# Patient Record
Sex: Female | Born: 1980 | Race: White | Hispanic: No | Marital: Married | Smoking: Never smoker
Health system: Southern US, Community
[De-identification: ages and names within clinical notes are randomized; demographics above are authoritative.]

## PROBLEM LIST (undated history)

## (undated) DIAGNOSIS — J45909 Unspecified asthma, uncomplicated: Secondary | ICD-10-CM

## (undated) DIAGNOSIS — G43909 Migraine, unspecified, not intractable, without status migrainosus: Secondary | ICD-10-CM

## (undated) DIAGNOSIS — E7800 Pure hypercholesterolemia, unspecified: Secondary | ICD-10-CM

## (undated) DIAGNOSIS — K219 Gastro-esophageal reflux disease without esophagitis: Secondary | ICD-10-CM

## (undated) DIAGNOSIS — D649 Anemia, unspecified: Secondary | ICD-10-CM

## (undated) HISTORY — DX: Morbid (severe) obesity due to excess calories: E66.01

## (undated) HISTORY — PX: WRIST FRACTURE SURGERY: SHX121

## (undated) HISTORY — PX: TONSILLECTOMY: SUR1361

---

## 2021-04-11 ENCOUNTER — Other Ambulatory Visit: Payer: Self-pay

## 2021-04-11 ENCOUNTER — Encounter (HOSPITAL_BASED_OUTPATIENT_CLINIC_OR_DEPARTMENT_OTHER): Payer: Self-pay | Admitting: Emergency Medicine

## 2021-04-11 ENCOUNTER — Emergency Department (HOSPITAL_BASED_OUTPATIENT_CLINIC_OR_DEPARTMENT_OTHER)
Admission: EM | Admit: 2021-04-11 | Discharge: 2021-04-11 | Disposition: A | Discharge status: Home / Self Care | Payer: Self-pay | Attending: Emergency Medicine | Admitting: Emergency Medicine

## 2021-04-11 DIAGNOSIS — J45909 Unspecified asthma, uncomplicated: Secondary | ICD-10-CM | POA: Diagnosis not present

## 2021-04-11 DIAGNOSIS — H9203 Otalgia, bilateral: Secondary | ICD-10-CM | POA: Insufficient documentation

## 2021-04-11 DIAGNOSIS — J029 Acute pharyngitis, unspecified: Secondary | ICD-10-CM | POA: Diagnosis not present

## 2021-04-11 HISTORY — DX: Gastro-esophageal reflux disease without esophagitis: K21.9

## 2021-04-11 HISTORY — DX: Migraine, unspecified, not intractable, without status migrainosus: G43.909

## 2021-04-11 HISTORY — DX: Unspecified asthma, uncomplicated: J45.909

## 2021-04-11 LAB — GROUP A STREP BY PCR: Group A Strep by PCR: NOT DETECTED

## 2021-04-11 MED ORDER — PREDNISONE 10 MG PO TABS: ORAL_TABLET | ORAL | 0 refills | Status: AC

## 2021-04-11 MED ORDER — LIDOCAINE VISCOUS HCL 2 % MT SOLN: 15.0000 mL | OROMUCOSAL | 2 refills | Status: DC | PRN

## 2021-04-11 NOTE — Discharge Instructions (Addendum)
General Viral Syndrome Care Instructions:  Your symptoms are likely consistent with a viral illness. Viruses do not require or respond to antibiotics. Treatment is symptomatic care and it is important to note that these symptoms may last for 7-14 days.   Hand washing: Wash your hands throughout the day, but especially before and after touching the face, using the restroom, sneezing, coughing, or touching surfaces that have been coughed or sneezed upon. Hydration: Symptoms of most illnesses will be intensified and complicated by dehydration. Dehydration can also extend the duration of symptoms. Drink plenty of fluids and get plenty of rest. You should be drinking at least half a liter of water an hour to stay hydrated. Electrolyte drinks (ex. Gatorade, Powerade, Pedialyte) are also encouraged. You should be drinking enough fluids to make your urine light yellow, almost clear. If this is not the case, you are not drinking enough water. Please note that some of the treatments indicated below will not be effective if you are not adequately hydrated. Diet: Please concentrate on hydration, however, you may introduce food slowly.  Start with a clear liquid diet, progressed to a full liquid diet, and then bland solids as you are able. Pain or fever: Ibuprofen, Naproxen, or acetaminophen (generic for Tylenol) for pain or fever.  Antiinflammatory medications: Take 600 mg of ibuprofen every 6 hours or 440 mg (over the counter dose) to 500 mg (prescription dose) of naproxen every 12 hours for the next 3 days. After this time, these medications may be used as needed for pain. Take these medications with food to avoid upset stomach. Choose only one of these medications, do not take them together. Acetaminophen (generic for Tylenol): Should you continue to have additional pain while taking the ibuprofen or naproxen, you may add in acetaminophen as needed. Your daily total maximum amount of acetaminophen from all sources  should be limited to 4000mg /day for persons without liver problems, or 2000mg /day for those with liver problems. Cough: Teas, warm liquids, broths, and honey can help with cough. Prednisone: Take the prednisone, as directed, in its entirety. Viscous lidocaine: Use the viscous lidocaine by gargling a small amount as needed to help with throat pain. Zyrtec or Claritin: May add these medication daily to control underlying symptoms of congestion, sneezing, and other signs of allergies.  These medications are available over-the-counter. Generics: Cetirizine (generic for Zyrtec) and loratadine (generic for Claritin). Fluticasone: Use fluticasone (generic for Flonase), as directed, for nasal and sinus congestion.  This medication is available over-the-counter. Congestion: Plain guaifenesin (generic for plain Mucinex) may help relieve congestion. Saline sinus rinses and saline nasal sprays may also help relieve congestion. If you do not have high blood pressure, heart problems, or an allergy to such medications, you may also try phenylephrine or Sudafed. Sore throat: Warm liquids or Chloraseptic spray may help soothe a sore throat. Gargle twice a day with a salt water solution made from a half teaspoon of salt in a cup of warm water.  Follow up: Follow up with a primary care provider within the next two weeks should symptoms fail to resolve. Return: Return to the ED for significantly worsening symptoms, shortness of breath, persistent vomiting, large amounts of blood in stool, or any other major concerns.  For prescription assistance, may try using prescription discount sites or apps, such as goodrx.com

## 2021-04-11 NOTE — ED Triage Notes (Signed)
Pt reports sore throat that started yesterday. Denies fevers.

## 2021-04-11 NOTE — ED Provider Notes (Signed)
[facility] EMERGENCY DEPARTMENT Provider Note   CSN: [id number] Arrival date & time: 04/11/21  0856     History Chief Complaint  Patient presents with   Sore Throat    Madison Waters is a 40 y.o. female.  HPI     Madison Waters is a 40 y.o. female, with a history of asthma, GERD, migraines, presenting to the ED with sore throat beginning yesterday. Bilateral throat pain, described as soreness, moderate, nonradiating. Also endorses bilateral ear pain and pressure, nasal congestion, cough.  Denies fever/chills, chest pain, shortness of breath, N/V/D, neck swelling, drooling, or any other complaints.   Past Medical History:  Diagnosis Date   Asthma    GERD (gastroesophageal reflux disease)    Migraines     There are no problems to display for this patient.   History reviewed. No pertinent surgical history.   OB History   No obstetric history on file.     History reviewed. No pertinent family history.     Home Medications Prior to Admission medications   Medication Sig Start Date End Date Taking? Authorizing Provider  lidocaine (XYLOCAINE) 2 % solution Use as directed 15 mLs in the mouth or throat as needed for mouth pain. 04/11/21  Yes Isaah Furry C, PA-C  predniSONE (DELTASONE) 10 MG tablet Take 4 tablets (40 mg total) by mouth daily for 3 days, THEN 3 tablets (30 mg total) daily for 1 day, THEN 2 tablets (20 mg total) daily for 1 day, THEN 1 tablet (10 mg total) daily for 1 day. 04/11/21 04/17/21 Yes Holliday Sheaffer C, PA-C    Allergies    Sulfa antibiotics  Review of Systems   Review of Systems  Constitutional:  Negative for chills, diaphoresis and fever.  HENT:  Positive for congestion, ear pain, sinus pressure and sore throat. Negative for ear discharge, facial swelling, rhinorrhea, sinus pain and trouble swallowing.   Respiratory:  Positive for cough. Negative for shortness of breath.   Cardiovascular:  Negative for chest pain.  Gastrointestinal:   Negative for abdominal pain, diarrhea, nausea and vomiting.  Musculoskeletal:  Negative for neck pain and neck stiffness.  Neurological:  Negative for weakness.  All other systems reviewed and are negative.  Physical Exam Updated Vital Signs BP 137/89 (BP Location: Left Arm)   Pulse (!) 109   Temp 99.2 F (37.3 C) (Oral)   Resp 17   Ht 5\' 4"  (1.626 m)   Wt 122.5 kg   LMP 03/13/2021 (Approximate)   SpO2 100%   BMI 46.35 kg/m   Physical Exam Vitals and nursing note reviewed.  Constitutional:      General: She is not in acute distress.    Appearance: She is well-developed. She is not diaphoretic.  HENT:     Head: Normocephalic and atraumatic.     Right Ear: Tympanic membrane, ear canal and external ear normal.     Left Ear: Tympanic membrane, ear canal and external ear normal.     Nose: Congestion present.     Right Sinus: No maxillary sinus tenderness or frontal sinus tenderness.     Left Sinus: No maxillary sinus tenderness or frontal sinus tenderness.     Mouth/Throat:     Mouth: Mucous membranes are moist.     Pharynx: Uvula midline. Posterior oropharyngeal erythema present. No pharyngeal swelling, oropharyngeal exudate or uvula swelling.     Comments: No noted area of intraoral swelling or fluctuance.  No trismus or noted abnormal phonation.  Mouth opening to at least 3 finger widths.  Handles oral secretions without difficulty.  No noted facial swelling.  No sublingual swelling or tongue elevation.  No swelling or tenderness to the submental or submandibular regions.  No swelling or tenderness into the soft tissues of the neck.  Eyes:     Conjunctiva/sclera: Conjunctivae normal.  Cardiovascular:     Rate and Rhythm: Normal rate and regular rhythm.     Pulses: Normal pulses.     Heart sounds: Normal heart sounds.  Pulmonary:     Effort: Pulmonary effort is normal. No respiratory distress.     Breath sounds: Normal breath sounds.     Comments: No increased work of  breathing.  Speaks in full sentences without difficulty. Abdominal:     Tenderness: There is no guarding.  Musculoskeletal:     Cervical back: Normal range of motion and neck supple.  Lymphadenopathy:     Cervical: Cervical adenopathy present.  Skin:    General: Skin is warm and dry.  Neurological:     Mental Status: She is alert.  Psychiatric:        Mood and Affect: Mood and affect normal.        Speech: Speech normal.        Behavior: Behavior normal.    ED Results / Procedures / Treatments   Labs (all labs ordered are listed, but only abnormal results are displayed) Labs Reviewed  GROUP A STREP BY PCR    EKG None  Radiology No results found.  Procedures Procedures   Medications Ordered in ED Medications - No data to display  ED Course  I have reviewed the triage vital signs and the nursing notes.  Pertinent labs & imaging results that were available during my care of the patient were reviewed by me and considered in my medical decision making (see chart for details).    MDM Rules/Calculators/A&P                          Patient presents with sore throat, cough, congestion. Patient is nontoxic appearing, afebrile, not tachycardic on my exam, not tachypneic, not hypotensive, maintains excellent SPO2 on room air, and is in no apparent distress.   Strep test negative. Discussed COVID/influenza testing, chest x-ray, imaging of the neck.  Shared decision making.  Patient declined. The patient was given instructions for home care as well as return precautions. Patient voices understanding of these instructions, accepts the plan, and is comfortable with discharge.    Final Clinical Impression(s) / ED Diagnoses Final diagnoses:  Sore throat    Rx / DC Orders ED Discharge Orders          Ordered    predniSONE (DELTASONE) 10 MG tablet        04/11/21 1105    lidocaine (XYLOCAINE) 2 % solution  As needed        04/11/21 1105             Anselm Pancoast,  PA-C 04/11/21 1151    Gwyneth Sprout, MD 04/13/21 2212

## 2021-05-13 ENCOUNTER — Ambulatory Visit
Admission: RE | Admit: 2021-05-13 | Discharge: 2021-05-13 | Disposition: A | Discharge status: Home / Self Care | Payer: PRIVATE HEALTH INSURANCE | Source: Ambulatory Visit | Attending: Sports Medicine | Admitting: Sports Medicine

## 2021-05-13 ENCOUNTER — Other Ambulatory Visit: Payer: Self-pay

## 2021-05-13 ENCOUNTER — Other Ambulatory Visit: Payer: Self-pay | Admitting: Sports Medicine

## 2021-05-13 DIAGNOSIS — M25562 Pain in left knee: Secondary | ICD-10-CM

## 2021-05-13 DIAGNOSIS — M25561 Pain in right knee: Secondary | ICD-10-CM

## 2021-05-26 ENCOUNTER — Other Ambulatory Visit: Payer: Self-pay | Admitting: Physician Assistant

## 2021-05-26 ENCOUNTER — Ambulatory Visit
Admission: RE | Admit: 2021-05-26 | Discharge: 2021-05-26 | Disposition: A | Discharge status: Home / Self Care | Payer: PRIVATE HEALTH INSURANCE | Source: Ambulatory Visit | Attending: Physician Assistant | Admitting: Physician Assistant

## 2021-05-26 DIAGNOSIS — R509 Fever, unspecified: Secondary | ICD-10-CM

## 2021-05-26 DIAGNOSIS — R051 Acute cough: Secondary | ICD-10-CM

## 2021-05-26 DIAGNOSIS — R490 Dysphonia: Secondary | ICD-10-CM

## 2021-12-17 ENCOUNTER — Ambulatory Visit: Payer: PRIVATE HEALTH INSURANCE | Attending: Physical Therapy | Admitting: Physical Therapy

## 2021-12-17 NOTE — Therapy (Deleted)
?OUTPATIENT PHYSICAL THERAPY LOWER EXTREMITY EVALUATION ? ? ?Patient Name: Madison Waters ?MRN: [id number] ?DOB:09-10-1981, 41 y.o., female ?Today's Date: 12/17/2021 ? ? ? ?Past Medical History:  ?Diagnosis Date  ? Asthma   ? GERD (gastroesophageal reflux disease)   ? Migraines   ? ?No past surgical history on file. ?There are no problems to display for this patient. ? ? ?PCP: Pcp, No ? ?REFERRING PROVIDER: Christena Deem, MD ? ?THERAPY DIAG:  ?No diagnosis found. ? ?REFERRING DIAG: Referral diagnosis: Right knee pain [M25.561] ? ?SUBJECTIVE: ? ?PERTINENT PAST HISTORY:  ?***   ?     ?PRECAUTIONS: {Therapy precautions:24002} ? ?WEIGHT BEARING RESTRICTIONS No ? ?FALLS:  ?Has patient fallen in last 6 months? {yes/no:20286}, Number of falls: *** ? ?LIVING ENVIRONMENT: ?Lives with: {[facility] lives with:25569::"lives with their family"} ?Stairs: {yes/no:20286}; {Stairs:24000} ? ?MOI/History of condition: ? ?Onset date: *** ? ?Madison Waters is a 41 y.o. female who presents to clinic with chief complaint of ***.  *** ? ?From referring provider:  ? ? ? ? Red flags:  ?{has/denies:26543} {kerredflag:26542} ? ?Pain:  ?Are you having pain? {yes/no:20286} ?Pain location: *** ?NPRS scale:  ?current {NUMBERS; 0-10:5044}/10  ?average {NUMBERS; 0-10:5044}/10  ?Aggravating factors: *** ? NPRS, highest: {NUMBERS; 0-10:5044}/10 ?Relieving factors: *** ? NPRS: best: {NUMBERS; 0-10:5044}/10 ?Pain description: {PAIN DESCRIPTION:21022940} ?Stage: {Desc; acute/subacute/chronic:13799} ?Stability: {kerbetterworse:26715} ?24 hour pattern: ***  ? ?Occupation: *** ? ?Assistive Device: *** ? ?Hand Dominance: *** ? ?Patient Goals/Specific Activities: *** ? ? ?PLOF: {PLOF:24004} ? ?DIAGNOSTIC FINDINGS: *** ? ? ?OBJECTIVE:  ? ? GENERAL OBSERVATION/GAIT: ?  *** ? ?SENSATION: ? Light touch: {intact/deficits:24005} ? ?PALPATION: ?*** ? ?MUSCLE LENGTH: ?Hamstrings: Right {kerminsig:27227} restriction; Left {kerminsig:27227} restriction ?ASLR: Right  {keraslr:27228}; Left {keraslr:27228} ?Thomas test: Right {kerminsig:27227} restriction; Left {kerminsig:27227} restriction ?Ely's test: Right {kerminsig:27227} restriction; Left {kerminsig:27227} restriction ? ?LE MMT: ? ?MMT Right ?12/17/2021 Left ?12/17/2021  ?Hip flexion (L2, L3)    ?Knee extension (L3)    ?Knee flexion    ?Hip abduction    ?Hip extension    ?Hip external rotation    ?Hip internal rotation    ?Hip adduction    ?Ankle dorsiflexion (L4)    ?Ankle plantarflexion (S1)    ?Ankle inversion    ?Ankle eversion    ?Great Toe ext (L5)    ?Grossly    ? ?(Blank rows = not tested, score listed is out of 5 possible points.  N = WNL, D = diminished, C = clear for gross weakness with myotome testing, * = concordant pain with testing) ? ?LE ROM: ? ?ROM Right ?12/17/2021 Left ?12/17/2021  ?Hip flexion    ?Hip extension    ?Hip abduction    ?Hip adduction    ?Hip internal rotation    ?Hip external rotation    ?Knee flexion    ?Knee extension    ?Ankle dorsiflexion    ?Ankle plantarflexion    ?Ankle inversion    ?Ankle eversion    ? ?(Blank rows = not tested, N = WNL, * = concordant pain with testing) ? ?Functional Tests ? ?Eval (12/17/2021) Eval (12/17/2021)   ?30'' STS    ?Progressive balance screen (highest level completed for >/= 10''): ? ?Feet together: ***'' ?Semi Tandem: R in rear ***'', L in rear ***'' ?Tandem: R in rear ***'', L in rear ***'' ?SLS: R ***'', L ***''     ?    ?    ?    ?    ?    ?    ?    ?    ?    ?    ?    ?    ? ? ?  LOWER EXTREMITY SPECIAL TESTS:  ?Knee special tests: {KNEE SPECIAL TESTS:26240} ? ? ?PATIENT SURVEYS:  ?{rehab surveys:24030} ? ? ?TODAY'S TREATMENT: ?*** ? ? ?PATIENT EDUCATION:  ?POC, diagnosis, prognosis, HEP, and outcome measures.  Pt educated via explanation, demonstration, and handout (HEP).  Pt confirms understanding verbally.  ? ?ASTERISK SIGNS ? ? ?Asterisk Signs Eval (12/17/2021)       ?        ?        ?        ?        ?        ? ? ?HOME EXERCISE  PROGRAM: ?*** ? ?ASSESSMENT: ? ?CLINICAL IMPRESSION: ?Madison Waters is a 41 y.o. female who presents to clinic with signs and sxs consistent with ***.   ? ?OBJECTIVE IMPAIRMENTS: Pain, *** ? ?ACTIVITY LIMITATIONS: *** ? ?PERSONAL FACTORS: See medical history and pertinent history ? ? ?REHAB POTENTIAL: {rehabpotential:25112} ? ?CLINICAL DECISION MAKING: {clinical decision making:25114} ? ?EVALUATION COMPLEXITY: {Evaluation complexity:25115} ? ? ?GOALS: ? ? ?SHORT TERM GOALS: ? ?Madison Waters will be >75% HEP compliant to improve carryover between sessions and facilitate independent management of condition ? ?Evaluation (12/17/2021): ongoing ?Target date: 02/11/2022 ?Goal status: INITIAL ? ? ?LONG TERM GOALS: ? ?Madison Waters will improve FOTO score to *** as a proxy for functional improvement ? ?Evaluation/Baseline (12/17/2021): *** ?Target date: 02/11/2022 ?Goal status: INITIAL ? ? ?2.  Madison Waters will self report >/= 50% decrease in pain from evaluation  ? ?Evaluation/Baseline (12/17/2021): ***/10 max pain ?Target date: 02/11/2022 ?Goal status: INITIAL ? ? ?3.  *** ? ? ?4.  *** ? ? ?5.  *** ? ? ?6.  *** ? ? ?PLAN: ?PT FREQUENCY: 1-2x/week ? ?PT DURATION: 8 weeks (Ending 02/11/2022) ? ?PLANNED INTERVENTIONS: Therapeutic exercises, Aquatic therapy, Therapeutic activity, Neuro Muscular re-education, Gait training, Patient/Family education, Joint mobilization, Dry Needling, Electrical stimulation, Spinal mobilization and/or manipulation, Moist heat, Taping, Vasopneumatic device, Ionotophoresis 4mg /ml Dexamethasone, and Manual therapy ? ?PLAN FOR NEXT SESSION: *** ? ? ? PT, DPT ?12/17/2021, 9:29 AM ?

## 2021-12-31 ENCOUNTER — Ambulatory Visit
Admission: RE | Admit: 2021-12-31 | Discharge: 2021-12-31 | Disposition: A | Discharge status: Home / Self Care | Payer: Self-pay | Source: Ambulatory Visit | Attending: Internal Medicine | Admitting: Internal Medicine

## 2021-12-31 ENCOUNTER — Other Ambulatory Visit: Payer: Self-pay | Admitting: Internal Medicine

## 2021-12-31 DIAGNOSIS — J4531 Mild persistent asthma with (acute) exacerbation: Secondary | ICD-10-CM

## 2022-01-06 ENCOUNTER — Other Ambulatory Visit (HOSPITAL_COMMUNITY)
Admission: RE | Admit: 2022-01-06 | Discharge: 2022-01-06 | Disposition: A | Discharge status: Home / Self Care | Payer: PRIVATE HEALTH INSURANCE | Source: Ambulatory Visit | Attending: Nurse Practitioner | Admitting: Nurse Practitioner

## 2022-01-06 DIAGNOSIS — Z124 Encounter for screening for malignant neoplasm of cervix: Secondary | ICD-10-CM | POA: Insufficient documentation

## 2022-01-08 LAB — CYTOLOGY - PAP
Comment: NEGATIVE
Diagnosis: NEGATIVE
High risk HPV: NEGATIVE

## 2022-02-02 ENCOUNTER — Encounter: Payer: Self-pay | Admitting: Neurology

## 2022-03-12 ENCOUNTER — Emergency Department (HOSPITAL_BASED_OUTPATIENT_CLINIC_OR_DEPARTMENT_OTHER)
Admission: EM | Admit: 2022-03-12 | Discharge: 2022-03-12 | Disposition: A | Discharge status: Home / Self Care | Payer: No Typology Code available for payment source | Attending: Emergency Medicine | Admitting: Emergency Medicine

## 2022-03-12 ENCOUNTER — Encounter (HOSPITAL_BASED_OUTPATIENT_CLINIC_OR_DEPARTMENT_OTHER): Payer: Self-pay | Admitting: Emergency Medicine

## 2022-03-12 DIAGNOSIS — R410 Disorientation, unspecified: Secondary | ICD-10-CM | POA: Insufficient documentation

## 2022-03-12 HISTORY — DX: Anemia, unspecified: D64.9

## 2022-03-12 HISTORY — DX: Pure hypercholesterolemia, unspecified: E78.00

## 2022-03-12 LAB — CBC WITH DIFFERENTIAL/PLATELET
Abs Immature Granulocytes: 0.04 10*3/uL (ref 0.00–0.07)
Basophils Absolute: 0 10*3/uL (ref 0.0–0.1)
Basophils Relative: 1 %
Eosinophils Absolute: 0.1 10*3/uL (ref 0.0–0.5)
Eosinophils Relative: 2 %
HCT: 34.5 % — ABNORMAL LOW (ref 36.0–46.0)
Hemoglobin: 10.9 g/dL — ABNORMAL LOW (ref 12.0–15.0)
Immature Granulocytes: 1 %
Lymphocytes Relative: 17 %
Lymphs Abs: 1.4 10*3/uL (ref 0.7–4.0)
MCH: 25.5 pg — ABNORMAL LOW (ref 26.0–34.0)
MCHC: 31.6 g/dL (ref 30.0–36.0)
MCV: 80.6 fL (ref 80.0–100.0)
Monocytes Absolute: 0.3 10*3/uL (ref 0.1–1.0)
Monocytes Relative: 4 %
Neutro Abs: 6.1 10*3/uL (ref 1.7–7.7)
Neutrophils Relative %: 75 %
Platelets: 398 10*3/uL (ref 150–400)
RBC: 4.28 MIL/uL (ref 3.87–5.11)
RDW: 19 % — ABNORMAL HIGH (ref 11.5–15.5)
WBC: 8 10*3/uL (ref 4.0–10.5)
nRBC: 0 % (ref 0.0–0.2)

## 2022-03-12 LAB — COMPREHENSIVE METABOLIC PANEL
ALT: 18 U/L (ref 0–44)
AST: 20 U/L (ref 15–41)
Albumin: 3.6 g/dL (ref 3.5–5.0)
Alkaline Phosphatase: 55 U/L (ref 38–126)
Anion gap: 6 (ref 5–15)
BUN: 13 mg/dL (ref 6–20)
CO2: 26 mmol/L (ref 22–32)
Calcium: 9.1 mg/dL (ref 8.9–10.3)
Chloride: 109 mmol/L (ref 98–111)
Creatinine, Ser: 0.9 mg/dL (ref 0.44–1.00)
GFR, Estimated: >60 mL/min (ref >60)
Glucose, Bld: 116 mg/dL — ABNORMAL HIGH (ref 70–99)
Potassium: 4.1 mmol/L (ref 3.5–5.1)
Sodium: 141 mmol/L (ref 135–145)
Total Bilirubin: 0.3 mg/dL (ref 0.3–1.2)
Total Protein: 6.6 g/dL (ref 6.5–8.1)

## 2022-03-12 LAB — VITAMIN B12: Vitamin B-12: 239 pg/mL (ref 180–914)

## 2022-03-12 LAB — RETICULOCYTES
Immature Retic Fract: 23.7 % — ABNORMAL HIGH (ref 2.3–15.9)
RBC.: 4.37 MIL/uL (ref 3.87–5.11)
Retic Count, Absolute: 78.7 10*3/uL (ref 19.0–186.0)
Retic Ct Pct: 1.8 % (ref 0.4–3.1)

## 2022-03-12 LAB — IRON AND TIBC
Iron: 18 ug/dL — ABNORMAL LOW (ref 28–170)
Saturation Ratios: 4 % — ABNORMAL LOW (ref 10.4–31.8)
TIBC: 405 ug/dL (ref 250–450)
UIBC: 387 ug/dL

## 2022-03-12 LAB — CBG MONITORING, ED: Glucose-Capillary: 153 mg/dL — ABNORMAL HIGH (ref 70–99)

## 2022-03-12 LAB — FERRITIN: Ferritin: 6 ng/mL — ABNORMAL LOW (ref 11–307)

## 2022-03-12 LAB — TSH: TSH: 1.759 u[IU]/mL (ref 0.350–4.500)

## 2022-03-12 LAB — FOLATE: Folate: 16.6 ng/mL (ref >5.9)

## 2022-03-12 MED ORDER — SODIUM CHLORIDE 0.9 % IV BOLUS
1000.0000 mL | Freq: Once | INTRAVENOUS | Status: AC
  Administered 2022-03-12: 1000 mL via INTRAVENOUS

## 2022-03-12 NOTE — Discharge Instructions (Signed)
We saw you in the ER for transient spatial confusion.  All the results in the ER are normal, labs and imaging. We are not sure what is causing your symptoms.  Clinically, this does not appear to be stroke or global amnesia.  The workup in the ER is not complete, and is limited to screening for life threatening and emergent conditions only, so please see a primary care doctor for further evaluation.  Return to the ER if you start having one-sided numbness, one-sided weakness, slurred speech, dizziness, confusion.

## 2022-03-12 NOTE — ED Triage Notes (Signed)
Pt states she went to [employer] this morning and when she left and was driving she realized she was not in the area that she was supposed to be in and does not know how she got to that area

## 2022-03-12 NOTE — ED Notes (Signed)
ED Provider at bedside. 

## 2022-03-13 NOTE — ED Provider Notes (Signed)
[facility] EMERGENCY DEPARTMENT Provider Note   CSN: [id number] Arrival date & time: 03/12/22  7124     History  Chief Complaint  Patient presents with   disoriented    Madison Waters is a 41 y.o. female.  HPI     41 year old female comes into the ER with chief complaint of disorientation.  She indicates that she woke up this morning, dropped her husband off to work.  She then proceeded to go to somewhere for breakfast, and then was supposed to go to work.  However, she noted that she was actually closer to the home.  She did not recall how she ended up driving to her place.   She denies any associated new spinning sensation, vision change, confusion with simple tasks like operating her machinery/car.  At no point did she have chest pain, palpitations.  She denies any new medications.  She does take trazodone for sleep.  No history of similar events in the past.  No history of seizures, stroke.  Home Medications Prior to Admission medications   Medication Sig Start Date End Date Taking? Authorizing Provider  lidocaine (XYLOCAINE) 2 % solution Use as directed 15 mLs in the mouth or throat as needed for mouth pain. 04/11/21   Joy, Shawn C, PA-C      Allergies    Sulfa antibiotics    Review of Systems   Review of Systems  All other systems reviewed and are negative.   Physical Exam Updated Vital Signs BP 130/69   Pulse 92   Temp 98.8 F (37.1 C) (Oral)   Resp (!) 21   Ht 5\' 4"  (1.626 m)   Wt 129.7 kg   LMP 03/01/2022 (Approximate)   SpO2 99%   BMI 49.09 kg/m  Physical Exam Vitals and nursing note reviewed.  Constitutional:      Appearance: She is well-developed.  HENT:     Head: Atraumatic.  Eyes:     Extraocular Movements: Extraocular movements intact.     Pupils: Pupils are equal, round, and reactive to light.  Cardiovascular:     Rate and Rhythm: Normal rate.  Pulmonary:     Effort: Pulmonary effort is normal.  Musculoskeletal:     Cervical  back: Normal range of motion and neck supple.  Skin:    General: Skin is warm and dry.  Neurological:     Mental Status: She is alert and oriented to person, place, and time.     ED Results / Procedures / Treatments   Labs (all labs ordered are listed, but only abnormal results are displayed) Labs Reviewed  COMPREHENSIVE METABOLIC PANEL - Abnormal; Notable for the following components:      Result Value   Glucose, Bld 116 (*)    All other components within normal limits  CBC WITH DIFFERENTIAL/PLATELET - Abnormal; Notable for the following components:   Hemoglobin 10.9 (*)    HCT 34.5 (*)    MCH 25.5 (*)    RDW 19.0 (*)    All other components within normal limits  IRON AND TIBC - Abnormal; Notable for the following components:   Iron 18 (*)    Saturation Ratios 4 (*)    All other components within normal limits  FERRITIN - Abnormal; Notable for the following components:   Ferritin 6 (*)    All other components within normal limits  RETICULOCYTES - Abnormal; Notable for the following components:   Immature Retic Fract 23.7 (*)    All other  components within normal limits  CBG MONITORING, ED - Abnormal; Notable for the following components:   Glucose-Capillary 153 (*)    All other components within normal limits  VITAMIN B12  FOLATE  TSH    EKG EKG Interpretation  Date/Time:  Thursday March 12 2022 07:49:20 EDT Ventricular Rate:  104 PR Interval:  136 QRS Duration: 95 QT Interval:  344 QTC Calculation: 453 R Axis:   70 Text Interpretation: Sinus tachycardia No acute changes No significant change since last tracing Confirmed by Derwood Kaplan [id number]) on 03/12/2022 8:30:10 AM  Radiology No results found.  Procedures Procedures    Medications Ordered in ED Medications  sodium chloride 0.9 % bolus 1,000 mL (0 mLs Intravenous Stopped 03/12/22 0940)    ED Course/ Medical Decision Making/ A&P                           Medical Decision Making Amount and/or  Complexity of Data Reviewed Labs: ordered.   41 year old female comes to the ER after finding herself driving towards her home, when she should have actually been driving to work.  She is unsure how she got on the road towards her home.  She did not realize the mistake and was he was almost at her home.  Patient knew at that time that she was lost.  She knew who she was, where she was and what time of the date was.  She also realized she was in the wrong place and just could not remember driving this far out.  Does not appear that she had any transient global amnesia.  No focal neuro complaints or deficits.  Clinically, does not appear that she had a seizure.  Medication side effects reviewed.  She is on trazodone at night, but doubt that it had an impact given that she has been on that medication for several months now.  In the light of normal neurologic exam, I do not think CT scan of the brain is needed.  Patient is tachycardic at arrival.  We have ordered basic blood work-up including TSH.  Iron studies sent as she is noted to have microcytic anemia.  Patient indicates that she has had iron transfusion needed in the past.  While in the ER, patient was monitored.  She had no similar events.  She remained oriented x3.  Stable for discharge  Final Clinical Impression(s) / ED Diagnoses Final diagnoses:  Transient disorientation    Rx / DC Orders ED Discharge Orders     None         Derwood Kaplan, MD 03/13/22 [phone]

## 2022-04-30 DIAGNOSIS — R195 Other fecal abnormalities: Secondary | ICD-10-CM | POA: Diagnosis not present

## 2022-04-30 DIAGNOSIS — F5104 Psychophysiologic insomnia: Secondary | ICD-10-CM | POA: Diagnosis not present

## 2022-04-30 DIAGNOSIS — D509 Iron deficiency anemia, unspecified: Secondary | ICD-10-CM | POA: Diagnosis not present

## 2022-04-30 DIAGNOSIS — D500 Iron deficiency anemia secondary to blood loss (chronic): Secondary | ICD-10-CM | POA: Diagnosis not present

## 2022-05-30 DIAGNOSIS — R7303 Prediabetes: Secondary | ICD-10-CM | POA: Diagnosis not present

## 2022-06-07 DIAGNOSIS — J019 Acute sinusitis, unspecified: Secondary | ICD-10-CM | POA: Diagnosis not present

## 2022-06-10 DIAGNOSIS — R7303 Prediabetes: Secondary | ICD-10-CM | POA: Diagnosis not present

## 2022-06-26 DIAGNOSIS — K219 Gastro-esophageal reflux disease without esophagitis: Secondary | ICD-10-CM | POA: Diagnosis not present

## 2022-06-26 DIAGNOSIS — R1013 Epigastric pain: Secondary | ICD-10-CM | POA: Diagnosis not present

## 2022-06-26 DIAGNOSIS — R58 Hemorrhage, not elsewhere classified: Secondary | ICD-10-CM | POA: Diagnosis not present

## 2022-06-26 DIAGNOSIS — R110 Nausea: Secondary | ICD-10-CM | POA: Diagnosis not present

## 2022-06-26 NOTE — Progress Notes (Unsigned)
NEUROLOGY CONSULTATION NOTE  Madison Waters MRN: [id number] DOB: 01-22-1981  Referring provider: Thayer Ohm, PA Primary care provider: Thayer Ohm, PA  Reason for consult:  headaches and episode of disorientation  Assessment/Plan:   Migraine without aura, without status migrainosus, intractable - aggravated after COVID  Migraine prevention:  Start Aimovig 140mg  every 28 days Migraine rescue:  Try Nurtec.  Has promethazine for nausea if needed. Discontinue OTC NSAIDs and analgesics (Tylenol, Excedrin) Keep headache diary Follow up 4-5 months.    Subjective:  Madison Waters is a 41 year old female with asthma, iron-deficiency anemia, tinnitus and migraines who presents for migraines.  History supplemented by referring provider's note.  Onset:  42 years old.  Became daily since having COVID in end of August 2023. Location:  varies - temples, behind eyes, back of neck Quality:  initially pressure but progresses to throbbing/pounding Intensity:  Severe Aura:  absent Prodrome:  absent Associated symptoms:  Nausea, photophobia, phonophobia, scalp tenderness, joint pain, sometimes blurred vision.  She denies associated unilateral numbness or weakness. Duration:  1 to 2 hours if treated early, otherwise all day Frequency:  Prior to August 2023 - 5-10 times a month; Since August 2023 - daily Frequency of abortive medication: Tylenol and/or Excedrin daily Triggers:  menses, change in weather, sleep deprivation, stress Relieving factors:  ice pack Activity:  affects daily activity.  Pulse elevated.  Has been in some pain due to ovarian cyst rupture.   Past NSAIDS/analgesics:  ibuprofen (GI problems) Past abortive triptans:  sumatriptan (helped but causes sweating and skin sensitivity), rizatriptan Past abortive ergotamine:  none Past muscle relaxants:  none Past anti-emetic:  Zofran Past antihypertensive medications: none Past antidepressant medications:  venlafaxine  (side effects) Past anticonvulsant medications:  topiramate (adverse reaction) Past anti-CGRP:  Ubrelvy 100mg  (not helpful) Other past therapies:  none  Current NSAIDS/analgesics:  Excedrin MIgraine, Tylenol Current triptans:  none Current ergotamine:  none Current anti-emetic:  promethazine 25mg  Current muscle relaxants:  none Current Antihypertensive medications:  none Current Antidepressant medications:  duloxetine 60mg  daily, trazodone 100mg  (insomnia) Current Anticonvulsant medications:  none Current anti-CGRP:  Ubrelvy 100mg  Current Vitamins/Herbal/Supplements:  ferrous sulfate Current Antihistamines/Decongestants:  meclizine, zyrtec, Flonase Other therapy:  ice pack Hormone/birth control:  none    Caffeine:  no Diet:  3 liters of water daily.  Avoids soda, spicy foods, citrus, cut out most sugar, may skip lunch Exercise:  not routine Depression:  stable; Anxiety:  stable Other pain:  joint pain Sleep hygiene:  varies.  May take an hour to fall asleep and may wake up several times during the night.  Wakes up with migraines (3 AM) Family history of headache:  unknown      PAST MEDICAL HISTORY: Past Medical History:  Diagnosis Date   Anemia    Asthma    GERD (gastroesophageal reflux disease)    High cholesterol    Migraines     PAST SURGICAL HISTORY: Past Surgical History:  Procedure Laterality Date   TONSILLECTOMY     WRIST FRACTURE SURGERY Left     MEDICATIONS: Current Outpatient Medications on File Prior to Visit  Medication Sig Dispense Refill   lidocaine (XYLOCAINE) 2 % solution Use as directed 15 mLs in the mouth or throat as needed for mouth pain. 100 mL 2   No current facility-administered medications on file prior to visit.    ALLERGIES: Allergies  Allergen Reactions   Sulfa Antibiotics Hives    FAMILY HISTORY: Family History  Problem  Relation Age of Onset   Stroke Mother     Objective:  Blood pressure 136/84, pulse (!) 123, height 5'  4" (1.626 m), weight 264 lb 6.4 oz (119.9 kg), SpO2 97 %. General: No acute distress.  Patient appears well-groomed.   Head:  Normocephalic/atraumatic Eyes:  fundi examined but not visualized Neck: supple, no paraspinal tenderness, full range of motion Back: No paraspinal tenderness Heart: regular rate and rhythm Lungs: Clear to auscultation bilaterally. Vascular: No carotid bruits. Neurological Exam: Mental status: alert and oriented to person, place, and time, speech fluent and not dysarthric, language intact. Cranial nerves: CN I: not tested CN II: pupils equal, round and reactive to light, visual fields intact CN III, IV, VI:  full range of motion, no nystagmus, no ptosis CN V: facial sensation intact. CN VII: upper and lower face symmetric CN VIII: hearing intact CN IX, X: gag intact, uvula midline CN XI: sternocleidomastoid and trapezius muscles intact CN XII: tongue midline Bulk & Tone: normal, no fasciculations. Motor:  muscle strength 5/5 throughout Sensation:  Pinprick, temperature and vibratory sensation intact. Deep Tendon Reflexes:  2+ throughout,  toes downgoing.   Finger to nose testing:  Without dysmetria.   Heel to shin:  Without dysmetria.   Gait:  Normal station and stride.  Romberg negative.    Thank you for allowing me to take part in the care of this patient.  Metta Clines, DO  CC: Thayer Ohm, PA

## 2022-06-29 ENCOUNTER — Emergency Department (HOSPITAL_COMMUNITY)
Admission: EM | Admit: 2022-06-29 | Discharge: 2022-06-29 | Disposition: A | Discharge status: Home / Self Care | Payer: BC Managed Care – PPO | Attending: Emergency Medicine | Admitting: Emergency Medicine

## 2022-06-29 ENCOUNTER — Emergency Department (HOSPITAL_COMMUNITY): Payer: BC Managed Care – PPO

## 2022-06-29 ENCOUNTER — Encounter: Payer: Self-pay | Admitting: Neurology

## 2022-06-29 ENCOUNTER — Ambulatory Visit: Payer: No Typology Code available for payment source | Admitting: Neurology

## 2022-06-29 ENCOUNTER — Other Ambulatory Visit (HOSPITAL_BASED_OUTPATIENT_CLINIC_OR_DEPARTMENT_OTHER): Payer: Self-pay

## 2022-06-29 ENCOUNTER — Telehealth: Payer: Self-pay

## 2022-06-29 ENCOUNTER — Encounter (HOSPITAL_COMMUNITY): Payer: Self-pay

## 2022-06-29 VITALS — BP 136/84 | HR 123 | Ht 64.0 in | Wt 264.4 lb

## 2022-06-29 DIAGNOSIS — R109 Unspecified abdominal pain: Secondary | ICD-10-CM | POA: Diagnosis not present

## 2022-06-29 DIAGNOSIS — J45909 Unspecified asthma, uncomplicated: Secondary | ICD-10-CM | POA: Diagnosis not present

## 2022-06-29 DIAGNOSIS — G43019 Migraine without aura, intractable, without status migrainosus: Secondary | ICD-10-CM

## 2022-06-29 DIAGNOSIS — R1012 Left upper quadrant pain: Secondary | ICD-10-CM | POA: Insufficient documentation

## 2022-06-29 LAB — COMPREHENSIVE METABOLIC PANEL
ALT: 63 U/L — ABNORMAL HIGH (ref 0–44)
AST: 38 U/L (ref 15–41)
Albumin: 3.4 g/dL — ABNORMAL LOW (ref 3.5–5.0)
Alkaline Phosphatase: 87 U/L (ref 38–126)
Anion gap: 6 (ref 5–15)
BUN: 10 mg/dL (ref 6–20)
CO2: 24 mmol/L (ref 22–32)
Calcium: 8.5 mg/dL — ABNORMAL LOW (ref 8.9–10.3)
Chloride: 106 mmol/L (ref 98–111)
Creatinine, Ser: 0.87 mg/dL (ref 0.44–1.00)
GFR, Estimated: >60 mL/min (ref >60)
Glucose, Bld: 108 mg/dL — ABNORMAL HIGH (ref 70–99)
Potassium: 3.9 mmol/L (ref 3.5–5.1)
Sodium: 136 mmol/L (ref 135–145)
Total Bilirubin: 0.6 mg/dL (ref 0.3–1.2)
Total Protein: 6.5 g/dL (ref 6.5–8.1)

## 2022-06-29 LAB — URINALYSIS, ROUTINE W REFLEX MICROSCOPIC
Bacteria, UA: NONE SEEN
Bilirubin Urine: NEGATIVE
Glucose, UA: NEGATIVE mg/dL
Hgb urine dipstick: NEGATIVE
Ketones, ur: NEGATIVE mg/dL
Leukocytes,Ua: NEGATIVE
Nitrite: NEGATIVE
Protein, ur: NEGATIVE mg/dL
Specific Gravity, Urine: 1.017 (ref 1.005–1.030)
pH: 6 (ref 5.0–8.0)

## 2022-06-29 LAB — CBC WITH DIFFERENTIAL/PLATELET
Abs Immature Granulocytes: 0.03 10*3/uL (ref 0.00–0.07)
Basophils Absolute: 0.1 10*3/uL (ref 0.0–0.1)
Basophils Relative: 1 %
Eosinophils Absolute: 0.1 10*3/uL (ref 0.0–0.5)
Eosinophils Relative: 1 %
HCT: 36.3 % (ref 36.0–46.0)
Hemoglobin: 10.9 g/dL — ABNORMAL LOW (ref 12.0–15.0)
Immature Granulocytes: 0 %
Lymphocytes Relative: 47 %
Lymphs Abs: 3.4 10*3/uL (ref 0.7–4.0)
MCH: 23.7 pg — ABNORMAL LOW (ref 26.0–34.0)
MCHC: 30 g/dL (ref 30.0–36.0)
MCV: 78.9 fL — ABNORMAL LOW (ref 80.0–100.0)
Monocytes Absolute: 0.4 10*3/uL (ref 0.1–1.0)
Monocytes Relative: 6 %
Neutro Abs: 3.2 10*3/uL (ref 1.7–7.7)
Neutrophils Relative %: 45 %
Platelets: 279 10*3/uL (ref 150–400)
RBC: 4.6 MIL/uL (ref 3.87–5.11)
RDW: 16.3 % — ABNORMAL HIGH (ref 11.5–15.5)
WBC: 7.1 10*3/uL (ref 4.0–10.5)
nRBC: 0 % (ref 0.0–0.2)

## 2022-06-29 LAB — MAGNESIUM: Magnesium: 2.1 mg/dL (ref 1.7–2.4)

## 2022-06-29 LAB — PREGNANCY, URINE: Preg Test, Ur: NEGATIVE

## 2022-06-29 LAB — LIPASE, BLOOD: Lipase: 25 U/L (ref 11–51)

## 2022-06-29 MED ORDER — IOHEXOL 300 MG/ML  SOLN
100.0000 mL | Freq: Once | INTRAMUSCULAR | Status: AC | PRN
  Administered 2022-06-29: 100 mL via INTRAVENOUS

## 2022-06-29 MED ORDER — AIMOVIG 140 MG/ML ~~LOC~~ SOAJ: 140.0000 mg | SUBCUTANEOUS | 11 refills | Status: DC

## 2022-06-29 MED ORDER — KETOROLAC TROMETHAMINE 15 MG/ML IJ SOLN
15.0000 mg | Freq: Once | INTRAMUSCULAR | Status: AC
  Administered 2022-06-29: 15 mg via INTRAVENOUS
  Filled 2022-06-29: qty 1

## 2022-06-29 MED ORDER — NURTEC 75 MG PO TBDP: 75.0000 mg | ORAL_TABLET | Freq: Every day | ORAL | 11 refills | Status: DC | PRN

## 2022-06-29 MED ORDER — HYDROMORPHONE HCL 1 MG/ML IJ SOLN
0.5000 mg | Freq: Once | INTRAMUSCULAR | Status: AC
  Administered 2022-06-29: 0.5 mg via INTRAVENOUS
  Filled 2022-06-29: qty 1

## 2022-06-29 MED ORDER — LACTATED RINGERS IV BOLUS
1000.0000 mL | Freq: Once | INTRAVENOUS | Status: AC
  Administered 2022-06-29: 1000 mL via INTRAVENOUS

## 2022-06-29 MED ORDER — AIMOVIG 140 MG/ML ~~LOC~~ SOAJ
140.0000 mg | SUBCUTANEOUS | 11 refills | Status: DC
  Filled 2022-06-29: qty 1.12, 28d supply, fill #0

## 2022-06-29 MED ORDER — METOCLOPRAMIDE HCL 5 MG/ML IJ SOLN
10.0000 mg | Freq: Once | INTRAMUSCULAR | Status: AC
  Administered 2022-06-29: 10 mg via INTRAVENOUS
  Filled 2022-06-29: qty 2

## 2022-06-29 NOTE — Progress Notes (Signed)
Medication Samples have been provided to the patient.  Drug name: Nurtec       Strength: 75 mg        Qty: 1   LOT: [id number]  Exp.Date: 08/25  Dosing instructions: as needed  The patient has been instructed regarding the correct time, dose, and frequency of taking this medication, including desired effects and most common side effects.   Venetia Night 3:27 PM 06/29/2022

## 2022-06-29 NOTE — ED Triage Notes (Signed)
Pt arrived via POV, c/o abd pain, left flank pain, urinary frequency and low grade fevers at home. Endorses some nausea, denies any vomiting, diarrhea or dysuria.

## 2022-06-29 NOTE — Telephone Encounter (Signed)
Patient seen in office today. Per Dr.Jaffe Patient to start Aimovig 140 mg.  PA Team if you could start a PA for Aimovig 140 mg.

## 2022-06-29 NOTE — Patient Instructions (Addendum)
  Start Aimovig 140mg  every 28 days.   Take Nurtec at earliest onset of headache.  Maximum 1 tablet in 24 hours. Limit use of pain relievers to no more than 2 days out of the week.  These medications include acetaminophen, NSAIDs (ibuprofen/Advil/Motrin, naproxen/Aleve, triptans (Imitrex/sumatriptan), Excedrin, and narcotics.  This will help reduce risk of rebound headaches. Be aware of common food triggers:  - Caffeine:  coffee, black tea, cola, Mt. Dew  - Chocolate  - Dairy:  aged cheeses (brie, blue, cheddar, gouda, Pringle, provolone, Kismet, Swiss, etc), chocolate milk, buttermilk, sour cream, limit eggs and yogurt  - Nuts, peanut butter  - Alcohol  - Cereals/grains:  FRESH breads (fresh bagels, sourdough, doughnuts), yeast productions  - Processed/canned/aged/cured meats (pre-packaged deli meats, hotdogs)  - MSG/glutamate:  soy sauce, flavor enhancer, pickled/preserved/marinated foods  - Sweeteners:  aspartame (Equal, Nutrasweet).  Sugar and Splenda are okay  - Vegetables:  legumes (lima beans, lentils, snow peas, fava beans, pinto peans, peas, garbanzo beans), sauerkraut, onions, olives, pickles  - Fruit:  avocados, bananas, citrus fruit (orange, lemon, grapefruit), mango  - Other:  Frozen meals, macaroni and cheese Routine exercise Stay adequately hydrated (aim for 64 oz water daily) Keep headache diary Maintain proper stress management Maintain proper sleep hygiene Do not skip meals Consider supplements:  magnesium citrate 400mg  daily, riboflavin 400mg  daily, coenzyme Q10 100mg  three times daily. 12.  Follow up 4-5 months.

## 2022-06-29 NOTE — Discharge Instructions (Addendum)
Take Tylenol at home as needed for pain.  Pain should improve with time.  Please return to the emergency department for any new or worsening symptoms of concern.

## 2022-06-29 NOTE — ED Provider Notes (Signed)
[facility] DEPT Provider Note   CSN: [id number] Arrival date & time: 06/29/22  0709     History  Chief Complaint  Patient presents with   Abdominal Pain    Mima MAYTTE JACOT is a 41 y.o. female.   Abdominal Pain Associated symptoms: fever and nausea   Patient presents for left sided abdominal, flank, and back pain.  Medical history includes anemia, asthma, HLD, GERD, IBS, migraine headaches.  She has a remote history of pyelonephritis 20 years ago.  Onset of her pain was 3 days ago.  Initially, it was in her upper abdomen.  That is since migrated to her left flank and back.  She denies any dysuria but has had urinary frequency.  She has had nausea without vomiting.  She has had poor appetite and limited food intake.  She is able to drink fluids.  2 days ago, she began experiencing subjective fevers.  Current pain is 8/10 in severity.  Pain is worsened with movements and deep inspiration.  She denies any history of abdominal surgeries.  She has been having regular bowel movements, the last of which was this morning.  Her LMP was 1 month ago.  For her pain, she has been taking Tylenol only.  She endorses current nausea.     Home Medications Prior to Admission medications   Medication Sig Start Date End Date Taking? Authorizing Provider  lidocaine (XYLOCAINE) 2 % solution Use as directed 15 mLs in the mouth or throat as needed for mouth pain. 04/11/21   Joy, Shawn C, PA-C      Allergies    Sulfa antibiotics    Review of Systems   Review of Systems  Constitutional:  Positive for appetite change and fever.  Gastrointestinal:  Positive for abdominal pain and nausea.  Genitourinary:  Positive for flank pain.  Musculoskeletal:  Positive for back pain.  All other systems reviewed and are negative.   Physical Exam Updated Vital Signs BP 134/79   Pulse 92   Temp 97.9 F (36.6 C) (Oral)   Resp 16 Comment: Simultaneous filing. User may not have seen  previous data.  SpO2 96%  Physical Exam Vitals and nursing note reviewed.  Constitutional:      General: She is not in acute distress.    Appearance: She is well-developed. She is not ill-appearing, toxic-appearing or diaphoretic.  HENT:     Head: Normocephalic and atraumatic.     Mouth/Throat:     Mouth: Mucous membranes are moist.     Pharynx: Oropharynx is clear.  Eyes:     General: No scleral icterus.    Conjunctiva/sclera: Conjunctivae normal.  Cardiovascular:     Rate and Rhythm: Normal rate and regular rhythm.  Pulmonary:     Effort: Pulmonary effort is normal. No respiratory distress.  Abdominal:     Palpations: Abdomen is soft.     Tenderness: There is abdominal tenderness in the left upper quadrant. There is left CVA tenderness. There is no right CVA tenderness, guarding or rebound.  Musculoskeletal:        General: No swelling.     Cervical back: Neck supple.  Skin:    General: Skin is warm and dry.     Capillary Refill: Capillary refill takes less than 2 seconds.     Coloration: Skin is not cyanotic, jaundiced or pale.  Neurological:     General: No focal deficit present.     Mental Status: She is alert and oriented to person,  place, and time.  Psychiatric:        Mood and Affect: Mood normal.        Behavior: Behavior normal.     ED Results / Procedures / Treatments   Labs (all labs ordered are listed, but only abnormal results are displayed) Labs Reviewed  COMPREHENSIVE METABOLIC PANEL - Abnormal; Notable for the following components:      Result Value   Glucose, Bld 108 (*)    Calcium 8.5 (*)    Albumin 3.4 (*)    ALT 63 (*)    All other components within normal limits  CBC WITH DIFFERENTIAL/PLATELET - Abnormal; Notable for the following components:   Hemoglobin 10.9 (*)    MCV 78.9 (*)    MCH 23.7 (*)    RDW 16.3 (*)    All other components within normal limits  URINE CULTURE  LIPASE, BLOOD  URINALYSIS, ROUTINE W REFLEX MICROSCOPIC  MAGNESIUM   PREGNANCY, URINE    EKG None  Radiology CT ABDOMEN PELVIS W CONTRAST  Result Date: 06/29/2022 CLINICAL DATA:  Nausea and vomiting. Abdominal pain. LEFT flank pain. EXAM: CT ABDOMEN AND PELVIS WITH CONTRAST TECHNIQUE: Multidetector CT imaging of the abdomen and pelvis was performed using the standard protocol following bolus administration of intravenous contrast. RADIATION DOSE REDUCTION: This exam was performed according to the departmental dose-optimization program which includes automated exposure control, adjustment of the mA and/or kV according to patient size and/or use of iterative reconstruction technique. CONTRAST:  14mL OMNIPAQUE IOHEXOL 300 MG/ML  SOLN COMPARISON:  None Available. FINDINGS: Lower chest: No acute abnormality. Hepatobiliary: Benign calcification within the RIGHT hepatic lobe. Otherwise, no focal liver abnormality is seen. No radiopaque gallstones, biliary dilatation, or pericholecystic inflammatory changes. Pancreas: Unremarkable. No pancreatic ductal dilatation or surrounding inflammatory changes. Spleen: Spleen is enlarged.  No focal lesion. Adrenals/Urinary Tract: Normal adrenal glands. Kidneys and adrenal glands are unremarkable. The bladder and visualized portion of the urethra are normal. Stomach/Bowel: Stomach and small bowel loops are unremarkable. The appendix is well seen and normal in appearance. Normal appearance of the large bowel. Vascular/Lymphatic: No significant vascular findings are present. No enlarged abdominal or pelvic lymph nodes. Reproductive: Normal appearance of the uterus. The there is a small amount of fluid surrounding the LEFT ovary. Small ovarian cyst on the LEFT ovary is 1.2 centimeters. Other: Small fat containing paraumbilical hernia is present. Musculoskeletal: No acute or significant osseous findings. IMPRESSION: 1. Splenomegaly.  No focal splenic lesion. 2. 1.2 centimeter LEFT ovarian cyst with small amount of fluid surrounding the LEFT  ovary. Ovarian cyst rupture could account for the patient's LEFT flank pain. Recommend follow-up pelvic ultrasound in 6-8 weeks. 3. Small fat containing paraumbilical hernia. Electronically Signed   By: Nolon Nations M.D.   On: 06/29/2022 10:33   DG Chest Portable 1 View  Result Date: 06/29/2022 CLINICAL DATA:  Left upper quadrant pain EXAM: PORTABLE CHEST 1 VIEW COMPARISON:  December 31, 2021 FINDINGS: No pleural effusion. No pneumothorax. No focal airspace opacity. Unchanged cardiac and mediastinal contours. No displaced rib fractures. Visualized upper abdomen is unremarkable. IMPRESSION: No finding to explain left upper quadrant abdominal pain. Electronically Signed   By: Marin Roberts M.D.   On: 06/29/2022 08:52    Procedures Procedures    Medications Ordered in ED Medications  lactated ringers bolus 1,000 mL (0 mLs Intravenous Stopped 06/29/22 1104)  metoCLOPramide (REGLAN) injection 10 mg (10 mg Intravenous Given 06/29/22 0834)  HYDROmorphone (DILAUDID) injection 0.5 mg (0.5 mg Intravenous  Given 06/29/22 0834)  iohexol (OMNIPAQUE) 300 MG/ML solution 100 mL (100 mLs Intravenous Contrast Given 06/29/22 1009)  ketorolac (TORADOL) 15 MG/ML injection 15 mg (15 mg Intravenous Given 06/29/22 1106)    ED Course/ Medical Decision Making/ A&P                           Medical Decision Making Amount and/or Complexity of Data Reviewed Labs: ordered. Radiology: ordered.  Risk Prescription drug management.   This patient presents to the ED for concern of left-sided abdominal pain, this involves an extensive number of treatment options, and is a complaint that carries with it a high risk of complications and morbidity.  The differential diagnosis includes nephrolithiasis, pyelonephritis, diverticulitis, constipation, adnexal etiology   Co morbidities that complicate the patient evaluation  anemia, asthma, HLD, GERD, IBS, migraine headaches   Additional history obtained:  Additional history  obtained from N/A External records from outside source obtained and reviewed including EMR   Lab Tests:  I Ordered, and personally interpreted labs.  The pertinent results include: Baseline anemia, no leukocytosis, normal electrolytes, normal kidney function, no evidence of UTI or hematuria   Imaging Studies ordered:  I ordered imaging studies including chest x-ray, CT of abdomen and pelvis I independently visualized and interpreted imaging which showed 1.2 cm left ovarian cyst with a small amount of surrounding fluid consistent with cyst rupture I agree with the radiologist interpretation   Cardiac Monitoring: / EKG:  The patient was maintained on a cardiac monitor.  I personally viewed and interpreted the cardiac monitored which showed an underlying rhythm of: Sinus rhythm  Problem List / ED Course / Critical interventions / Medication management  Patient is a pleasant 41 year old female presenting for left-sided abdominal, flank, and back pain.  Onset of pain was 3 days ago.  2 days ago, she had onset of subjective fevers and nausea.  She has not had any vomiting.  She denies dysuria but has had urinary frequency.  On arrival in the ED, vital signs are notable for tachycardia.  She is overall well-appearing on exam but does appear uncomfortable.  She does have tenderness present in left flank and left CVA.  Dilaudid and Reglan were given for symptomatic relief.  Given her poor p.o. intake, patient was ordered IV fluids.  Diagnostic work-up was initiated.  Patient's lab work, including urine studies, is reassuring.  There is no evidence of UTI or hematuria.  CT scan showed findings consistent with left-sided ovarian cyst rupture.  This does clinically correlate to her recent symptoms.  Splenomegaly was also noted and this could be contributing to her discomfort.  I do feel it is likely that ovarian cyst rupture is primary cause of her recent discomfort.  She was given Toradol for continued  analgesia.  She had significant relief of symptoms while in the ED.  She was informed of work-up results and does feel comfortable with discharge home.  She was advised to return for any worsening.  She was discharged in good condition. I ordered medication including IV fluid for hydration; Reglan for nausea; Dilaudid and Toradol for analgesia Reevaluation of the patient after these medicines showed that the patient improved I have reviewed the patients home medicines and have made adjustments as needed   Social Determinants of Health:  Has access to outpatient care         Final Clinical Impression(s) / ED Diagnoses Final diagnoses:  Left flank pain  Rx / DC Orders ED Discharge Orders     None         Godfrey Pick, MD 06/29/22 1219

## 2022-06-30 ENCOUNTER — Encounter: Payer: Self-pay | Admitting: Neurology

## 2022-06-30 ENCOUNTER — Telehealth: Payer: No Typology Code available for payment source | Admitting: Nurse Practitioner

## 2022-06-30 DIAGNOSIS — R110 Nausea: Secondary | ICD-10-CM | POA: Diagnosis not present

## 2022-06-30 LAB — URINE CULTURE: Culture: <10000 — AB

## 2022-06-30 MED ORDER — ONDANSETRON HCL 4 MG PO TABS: 4.0000 mg | ORAL_TABLET | Freq: Three times a day (TID) | ORAL | 0 refills | Status: DC | PRN

## 2022-06-30 NOTE — Progress Notes (Signed)
E-Visit for Nausea  We are sorry that you are not feeling well. Here is how we plan to help!  I reviewed your ER notes and we can go ahead and provide the prescription for Zofran, your work note will come through your Mychart as a letter.   I have prescribed a medication that will help alleviate your symptoms and allow you to stay hydrated:  Zofran 4 mg 1 tablet every 8 hours as needed for nausea and vomiting  HOME CARE: Drink clear liquids.  This is very important! Dehydration (the lack of fluid) can lead to a serious complication.  Start off with 1 tablespoon every 5 minutes for 8 hours. You may begin eating bland foods after 8 hours without vomiting.  Start with saltine crackers, white bread, rice, mashed potatoes, applesauce. After 48 hours on a bland diet, you may resume a normal diet. Try to go to sleep.  Sleep often empties the stomach and relieves the need to vomit.  GET HELP RIGHT AWAY IF:  Your symptoms do not improve or worsen within 2 days after treatment. You have a fever for over 3 days. You cannot keep down fluids after trying the medication.  MAKE SURE YOU:  Understand these instructions. Will watch your condition. Will get help right away if you are not doing well or get worse.    Thank you for choosing an e-visit.  Your e-visit answers were reviewed by a board certified advanced clinical practitioner to complete your personal care plan. Depending upon the condition, your plan could have included both over the counter or prescription medications.  Please review your pharmacy choice. Make sure the pharmacy is open so you can pick up prescription now. If there is a problem, you may contact your provider through [employer] and have the prescription routed to another pharmacy.  Your safety is important to [place]. If you have drug allergies check your prescription carefully.   For the next 24 hours you can use MyChart to ask questions about today's visit, request a  non-urgent call back, or ask for a work or school excuse. You will get an email in the next two days asking about your experience. I hope that your e-visit has been valuable and will speed your recovery.  Meds ordered this encounter  Medications   ondansetron (ZOFRAN) 4 MG tablet    Sig: Take 1 tablet (4 mg total) by mouth every 8 (eight) hours as needed for nausea or vomiting.    Dispense:  20 tablet    Refill:  0     I spent approximately 7 minutes reviewing the patient's history, current symptoms and coordinating their plan of care today.

## 2022-07-01 ENCOUNTER — Other Ambulatory Visit (HOSPITAL_COMMUNITY): Payer: Self-pay

## 2022-07-01 ENCOUNTER — Telehealth: Payer: Self-pay | Admitting: Pharmacy Technician

## 2022-07-01 NOTE — Telephone Encounter (Signed)
Patient Advocate Encounter   Received notification that prior authorization for Aimovig 140MG /ML auto-injectors is required.   PA submitted on 07/01/2022 Key [id number] Status is pending       Lyndel Safe, [facility] Patient Advocate Specialist [facility] Patient Advocate Team Direct Number: [phone]  Fax: [phone]

## 2022-07-06 NOTE — Telephone Encounter (Signed)
Patient Advocate Encounter  Prior Authorization for Aimovig 140MG /ML auto-injectors  has been approved.     Effective: 07-01-2022 to 09-22-2022

## 2022-07-06 NOTE — Telephone Encounter (Signed)
LMOVM for patient, Aimovig approved.

## 2022-07-09 DIAGNOSIS — R1012 Left upper quadrant pain: Secondary | ICD-10-CM | POA: Diagnosis not present

## 2022-07-09 DIAGNOSIS — D509 Iron deficiency anemia, unspecified: Secondary | ICD-10-CM | POA: Diagnosis not present

## 2022-07-09 DIAGNOSIS — K2930 Chronic superficial gastritis without bleeding: Secondary | ICD-10-CM | POA: Diagnosis not present

## 2022-07-09 DIAGNOSIS — R1013 Epigastric pain: Secondary | ICD-10-CM | POA: Diagnosis not present

## 2022-07-09 DIAGNOSIS — R140 Abdominal distension (gaseous): Secondary | ICD-10-CM | POA: Diagnosis not present

## 2022-07-09 DIAGNOSIS — K259 Gastric ulcer, unspecified as acute or chronic, without hemorrhage or perforation: Secondary | ICD-10-CM | POA: Diagnosis not present

## 2022-07-18 DIAGNOSIS — R7303 Prediabetes: Secondary | ICD-10-CM | POA: Diagnosis not present

## 2022-08-03 ENCOUNTER — Telehealth: Payer: No Typology Code available for payment source | Admitting: Physician Assistant

## 2022-08-03 DIAGNOSIS — J019 Acute sinusitis, unspecified: Secondary | ICD-10-CM

## 2022-08-03 DIAGNOSIS — B9689 Other specified bacterial agents as the cause of diseases classified elsewhere: Secondary | ICD-10-CM | POA: Diagnosis not present

## 2022-08-03 MED ORDER — AMOXICILLIN-POT CLAVULANATE 875-125 MG PO TABS: 1.0000 | ORAL_TABLET | Freq: Two times a day (BID) | ORAL | 0 refills | Status: DC

## 2022-08-03 NOTE — Progress Notes (Signed)

## 2022-08-11 DIAGNOSIS — R7303 Prediabetes: Secondary | ICD-10-CM | POA: Diagnosis not present

## 2022-09-01 ENCOUNTER — Telehealth: Payer: No Typology Code available for payment source | Admitting: Physician Assistant

## 2022-09-01 DIAGNOSIS — R300 Dysuria: Secondary | ICD-10-CM

## 2022-09-01 DIAGNOSIS — R509 Fever, unspecified: Secondary | ICD-10-CM

## 2022-09-01 DIAGNOSIS — R109 Unspecified abdominal pain: Secondary | ICD-10-CM

## 2022-09-01 DIAGNOSIS — R10A0 Flank pain, unspecified side: Secondary | ICD-10-CM

## 2022-09-01 NOTE — Progress Notes (Signed)
Because of mid and upper back pain/pain with breathing along with UTI symptoms and fever, I feel your condition warrants further evaluation and I recommend that you be seen in a face to face visit. I am concerned about a more complicated urinary tract infection and you need a urine culture and examination of abdomen and kidneys/flank.   NOTE: There will be NO CHARGE for this eVisit   If you are having a true medical emergency please call 911.      For an urgent face to face visit, [facility] has seven urgent care centers for your convenience:     [facility] at [facility] Directions [phone] [address] [place], [place] [address]    [facility]) Get Driving Directions [phone] [address] [place], [place] [address]  [facility] Center [facility]) Get Driving Directions [phone] [address] [place],  [place]  [address]  [facility] Health Urgent Care Center [facility] - at [employer] Directions  [phone] [address] [place],  [place] [address]   [facility] Health Urgent Care at [facility] Get Driving Directions [phone], [address] [place], [place] [address]   [facility] Health Urgent Care at [facility] Get Driving Directions  [phone] [address] [place], [place] [address]   [facility] Urgent Care at [facility] Directions [phone] [address], [address] [place], [place] [address]  Your MyChart E-visit questionnaire answers were reviewed by a board certified advanced clinical practitioner to complete your personal care plan based on your specific symptoms.  Thank you for using e-Visits.

## 2022-09-02 DIAGNOSIS — R300 Dysuria: Secondary | ICD-10-CM | POA: Diagnosis not present

## 2022-09-02 DIAGNOSIS — N342 Other urethritis: Secondary | ICD-10-CM | POA: Diagnosis not present

## 2022-09-02 DIAGNOSIS — S29012A Strain of muscle and tendon of back wall of thorax, initial encounter: Secondary | ICD-10-CM | POA: Diagnosis not present

## 2022-09-11 ENCOUNTER — Telehealth: Payer: Self-pay | Admitting: Pharmacy Technician

## 2022-09-11 NOTE — Telephone Encounter (Signed)
Patient Advocate Encounter   Received notification that prior authorization for Aimovig 140MG /ML auto-injectors is required.   PA submitted on 09/11/2022 Key [id number] Status is pending       09/13/2022, [facility]Ph[facility] Pharm[facility] Patient Advocate Specialist [facility] Patient Advocate Team Direct Number: [phone]  Fax: [phone]

## 2022-09-12 ENCOUNTER — Telehealth: Payer: BC Managed Care – PPO | Admitting: Nurse Practitioner

## 2022-09-12 DIAGNOSIS — J40 Bronchitis, not specified as acute or chronic: Secondary | ICD-10-CM | POA: Diagnosis not present

## 2022-09-12 MED ORDER — AZITHROMYCIN 250 MG PO TABS: ORAL_TABLET | ORAL | 0 refills | Status: DC

## 2022-09-12 NOTE — Progress Notes (Signed)
We are sorry that you are not feeling well.  Here is how we plan to help!  Based on your presentation I believe you most likely have A cough due to bacteria.  When patients have a fever and a productive cough with a change in color or increased sputum production, we are concerned about bacterial bronchitis.  If left untreated it can progress to pneumonia.  If your symptoms do not improve with your treatment plan it is important that you contact your provider.   I have prescribed Azithromyin 250 mg: two tablets now and then one tablet daily for 4 additonal days    In addition you may use A non-prescription cough medication called Mucinex DM: take 2 tablets every 12 hours.   From your responses in the eVisit questionnaire you describe inflammation in the upper respiratory tract which is causing a significant cough.  This is commonly called Bronchitis and has four common causes:   Allergies Viral Infections Acid Reflux Bacterial Infection Allergies, viruses and acid reflux are treated by controlling symptoms or eliminating the cause. An example might be a cough caused by taking certain blood pressure medications. You stop the cough by changing the medication. Another example might be a cough caused by acid reflux. Controlling the reflux helps control the cough.  USE OF BRONCHODILATOR ("RESCUE") INHALERS: There is a risk from using your bronchodilator too frequently.  The risk is that over-reliance on a medication which only relaxes the muscles surrounding the breathing tubes can reduce the effectiveness of medications prescribed to reduce swelling and congestion of the tubes themselves.  Although you feel brief relief from the bronchodilator inhaler, your asthma may actually be worsening with the tubes becoming more swollen and filled with mucus.  This can delay other crucial treatments, such as oral steroid medications. If you need to use a bronchodilator inhaler daily, several times per day, you should  discuss this with your provider.  There are probably better treatments that could be used to keep your asthma under control.     HOME CARE Only take medications as instructed by your medical team. Complete the entire course of an antibiotic. Drink plenty of fluids and get plenty of rest. Avoid close contacts especially the very young and the elderly Cover your mouth if you cough or cough into your sleeve. Always remember to wash your hands A steam or ultrasonic humidifier can help congestion.   GET HELP RIGHT AWAY IF: You develop worsening fever. You become short of breath You cough up blood. Your symptoms persist after you have completed your treatment plan MAKE SURE YOU  Understand these instructions. Will watch your condition. Will get help right away if you are not doing well or get worse.    Thank you for choosing an e-visit.  Your e-visit answers were reviewed by a board certified advanced clinical practitioner to complete your personal care plan. Depending upon the condition, your plan could have included both over the counter or prescription medications.  Please review your pharmacy choice. Make sure the pharmacy is open so you can pick up prescription now. If there is a problem, you may contact your provider through [employer] and have the prescription routed to another pharmacy.  Your safety is important to [place]. If you have drug allergies check your prescription carefully.   For the next 24 hours you can use MyChart to ask questions about today's visit, request a non-urgent call back, or ask for a work or school excuse. You will  get an email in the next two days asking about your experience. I hope that your e-visit has been valuable and will speed your recovery.  Mary-Margaret Hassell Done, FNP   5-10 minutes spent reviewing and documenting in chart.

## 2022-09-13 ENCOUNTER — Other Ambulatory Visit: Payer: Self-pay | Admitting: Family

## 2022-09-13 MED ORDER — PREDNISONE 10 MG (21) PO TBPK: ORAL_TABLET | ORAL | 0 refills | Status: DC

## 2022-09-13 NOTE — Addendum Note (Signed)
Addended by: Jannifer Rodney A on: 09/13/2022 07:58 AM   Modules accepted: Orders

## 2022-09-14 NOTE — Telephone Encounter (Signed)
Patient Advocate Encounter  Prior Authorization for Aimovig 140MG /ML auto-injectors has been approved through .    Effective: 09-11-2022 to 09-10-2023

## 2022-10-26 DIAGNOSIS — M545 Low back pain, unspecified: Secondary | ICD-10-CM | POA: Diagnosis not present

## 2022-11-17 ENCOUNTER — Other Ambulatory Visit: Payer: Self-pay | Admitting: Neurology

## 2022-11-17 ENCOUNTER — Telehealth: Payer: Self-pay

## 2022-11-17 MED ORDER — SUMATRIPTAN SUCCINATE 100 MG PO TABS: 100.0000 mg | ORAL_TABLET | ORAL | 5 refills | Status: DC | PRN

## 2022-11-17 NOTE — Progress Notes (Deleted)
NEUROLOGY FOLLOW UP OFFICE NOTE  Madison Waters [id number]  Assessment/Plan:   Migraine without aura, without status migrainosus, intractable - aggravated after COVID   Migraine prevention:  Start Aimovig 153m every 28 days Migraine rescue:  Try Nurtec.  Has promethazine for nausea if needed. Discontinue OTC NSAIDs and analgesics (Tylenol, Excedrin) Keep headache diary Follow up 4-5 months.       Subjective:  Madison Waters a 42year old female with asthma, iron-deficiency anemia, tinnitus and migraines who follows up for migraines.  UPDATE: Plan was to start [place]  Nurtec ***  Intensity:  *** Duration:  *** Frequency:  *** Frequency of abortive medication: *** Current NSAIDS/analgesics:  Excedrin MIgraine, Tylenol Current triptans:  none Current ergotamine:  none Current anti-emetic:  promethazine 29mCurrent muscle relaxants:  none Current Antihypertensive medications:  none Current Antidepressant medications:  duloxetine 6066maily, trazodone 100m85mnsomnia) Current Anticonvulsant medications:  none Current anti-CGRP:  Ubrelvy 100mg28mrent Vitamins/Herbal/Supplements:  ferrous sulfate Current Antihistamines/Decongestants:  meclizine, zyrtec, Flonase Other therapy:  ice pack Hormone/birth control:  none       Caffeine:  no Diet:  3 liters of water daily.  Avoids soda, spicy foods, citrus, cut out most sugar, may skip lunch Exercise:  not routine Depression:  stable; Anxiety:  stable Other pain:  joint pain Sleep hygiene:  varies.  May take an hour to fall asleep and may wake up several times during the night.  Wakes up with migraines (3 AM)  HISTORY:  Onset:  19 ye32s old.  Became daily since having COVID in end of August 2023. Location:  varies - temples, behind eyes, back of neck Quality:  initially pressure but progresses to throbbing/pounding Intensity:  Severe Aura:  absent Prodrome:  absent Associated symptoms:  Nausea, photophobia,  phonophobia, scalp tenderness, joint pain, sometimes blurred vision.  She denies associated unilateral numbness or weakness. Duration:  1 to 2 hours if treated early, otherwise all day Frequency:  Prior to August 2023 - 5-10 times a month; Since August 2023 - daily Frequency of abortive medication: Tylenol and/or Excedrin daily Triggers:  menses, change in weather, sleep deprivation, stress Relieving factors:  ice pack Activity:  affects daily activity.   Pulse elevated.  Has been in some pain due to ovarian cyst rupture.     Past NSAIDS/analgesics:  ibuprofen (GI problems) Past abortive triptans:  sumatriptan (helped but causes sweating and skin sensitivity), rizatriptan Past abortive ergotamine:  none Past muscle relaxants:  none Past anti-emetic:  Zofran Past antihypertensive medications: none Past antidepressant medications:  venlafaxine (side effects) Past anticonvulsant medications:  topiramate (adverse reaction) Past anti-CGRP:  Ubrelvy 100mg 14m helpful) Other past therapies:  none    Family history of headache:  unknown  PAST MEDICAL HISTORY: Past Medical History:  Diagnosis Date   Anemia    Asthma    GERD (gastroesophageal reflux disease)    High cholesterol    Migraines     MEDICATIONS: Current Outpatient Medications on File Prior to Visit  Medication Sig Dispense Refill   ADVAIR DISKUS 250-50 MCG/ACT AEPB Inhale 1 puff into the lungs 2 (two) times daily.     albuterol (VENTOLIN HFA) 108 (90 Base) MCG/ACT inhaler Inhale 2 puffs into the lungs every 6 (six) hours as needed.     amoxicillin-clavulanate (AUGMENTIN) 875-125 MG tablet Take 1 tablet by mouth 2 (two) times daily. 14 tablet 0   azithromycin (ZITHROMAX Z-PAK) 250 MG tablet As directed 6 tablet 0  DULoxetine (CYMBALTA) 60 MG capsule Take 60 mg by mouth daily.     Erenumab-aooe (AIMOVIG) 140 MG/ML SOAJ Inject 140 mg into the skin every 28 (twenty-eight) days. 1.12 mL 11   ondansetron (ZOFRAN) 4 MG tablet  Take 1 tablet (4 mg total) by mouth every 8 (eight) hours as needed for nausea or vomiting. 20 tablet 0   pantoprazole (PROTONIX) 40 MG tablet Take 40 mg by mouth 2 (two) times daily.     predniSONE (STERAPRED UNI-PAK 21 TAB) 10 MG (21) TBPK tablet Use as directed 21 tablet 0   promethazine (PHENERGAN) 25 MG tablet SMARTSIG:1 Tablet(s) By Mouth Every 12 Hours PRN     Rimegepant Sulfate (NURTEC) 75 MG TBDP Take 75 mg by mouth daily as needed. 8 tablet 11   sucralfate (CARAFATE) 1 g tablet Take 1 g by mouth 2 (two) times daily.     traZODone (DESYREL) 100 MG tablet Take 100 mg by mouth at bedtime.     No current facility-administered medications on file prior to visit.    ALLERGIES: Allergies  Allergen Reactions   Sulfa Antibiotics Hives    FAMILY HISTORY: Family History  Problem Relation Age of Onset   Stroke Mother       Objective:  *** General: No acute distress.  Patient appears ***-groomed.   Head:  Normocephalic/atraumatic Eyes:  Fundi examined but not visualized Neck: supple, no paraspinal tenderness, full range of motion Heart:  Regular rate and rhythm Lungs:  Clear to auscultation bilaterally Back: No paraspinal tenderness Neurological Exam: alert and oriented to person, place, and time.  Speech fluent and not dysarthric, language intact.  CN II-XII intact. Bulk and tone normal, muscle strength 5/5 throughout.  Sensation to light touch intact.  Deep tendon reflexes 2+ throughout, toes downgoing.  Finger to nose testing intact.  Gait normal, Romberg negative.   Madison Clines, DO  CC: ***

## 2022-11-17 NOTE — Telephone Encounter (Signed)
Patient came into the office thinking she had an appointment today, had to reschedule to next available in July. Madison Waters wanted to advise Dr.Jaffe that insurance no longer covers Nurtec as it is costing her about $1200. Wonders if she can go back to [facility], aware that she had bad side effects but it was a cheaper option for her. [facility] is [id number] [place] [address](Ph: [phone] .

## 2022-11-18 ENCOUNTER — Ambulatory Visit: Payer: No Typology Code available for payment source | Admitting: Neurology

## 2022-11-19 NOTE — Telephone Encounter (Signed)
Called patient and left voicemail that her medication has been sent to [facility]

## 2022-11-20 DIAGNOSIS — M25512 Pain in left shoulder: Secondary | ICD-10-CM | POA: Diagnosis not present

## 2022-12-11 DIAGNOSIS — S29012A Strain of muscle and tendon of back wall of thorax, initial encounter: Secondary | ICD-10-CM | POA: Diagnosis not present

## 2022-12-11 DIAGNOSIS — M67814 Other specified disorders of tendon, left shoulder: Secondary | ICD-10-CM | POA: Diagnosis not present

## 2023-01-01 DIAGNOSIS — S29012A Strain of muscle and tendon of back wall of thorax, initial encounter: Secondary | ICD-10-CM | POA: Diagnosis not present

## 2023-01-01 DIAGNOSIS — M67814 Other specified disorders of tendon, left shoulder: Secondary | ICD-10-CM | POA: Diagnosis not present

## 2023-01-20 DIAGNOSIS — E559 Vitamin D deficiency, unspecified: Secondary | ICD-10-CM | POA: Diagnosis not present

## 2023-01-20 DIAGNOSIS — N951 Menopausal and female climacteric states: Secondary | ICD-10-CM | POA: Diagnosis not present

## 2023-01-20 DIAGNOSIS — K581 Irritable bowel syndrome with constipation: Secondary | ICD-10-CM | POA: Diagnosis not present

## 2023-01-20 DIAGNOSIS — D539 Nutritional anemia, unspecified: Secondary | ICD-10-CM | POA: Diagnosis not present

## 2023-01-20 DIAGNOSIS — Z6838 Body mass index (BMI) 38.0-38.9, adult: Secondary | ICD-10-CM | POA: Diagnosis not present

## 2023-01-20 DIAGNOSIS — Z1159 Encounter for screening for other viral diseases: Secondary | ICD-10-CM | POA: Diagnosis not present

## 2023-01-20 DIAGNOSIS — M129 Arthropathy, unspecified: Secondary | ICD-10-CM | POA: Diagnosis not present

## 2023-01-20 DIAGNOSIS — R7303 Prediabetes: Secondary | ICD-10-CM | POA: Diagnosis not present

## 2023-01-20 DIAGNOSIS — Z0000 Encounter for general adult medical examination without abnormal findings: Secondary | ICD-10-CM | POA: Diagnosis not present

## 2023-01-27 DIAGNOSIS — M25512 Pain in left shoulder: Secondary | ICD-10-CM | POA: Diagnosis not present

## 2023-01-27 DIAGNOSIS — M67814 Other specified disorders of tendon, left shoulder: Secondary | ICD-10-CM | POA: Diagnosis not present

## 2023-01-27 DIAGNOSIS — S29012A Strain of muscle and tendon of back wall of thorax, initial encounter: Secondary | ICD-10-CM | POA: Diagnosis not present

## 2023-02-09 DIAGNOSIS — Z6838 Body mass index (BMI) 38.0-38.9, adult: Secondary | ICD-10-CM | POA: Diagnosis not present

## 2023-02-09 DIAGNOSIS — E559 Vitamin D deficiency, unspecified: Secondary | ICD-10-CM | POA: Diagnosis not present

## 2023-02-09 DIAGNOSIS — M25561 Pain in right knee: Secondary | ICD-10-CM | POA: Diagnosis not present

## 2023-02-09 DIAGNOSIS — D649 Anemia, unspecified: Secondary | ICD-10-CM | POA: Diagnosis not present

## 2023-02-09 DIAGNOSIS — M25562 Pain in left knee: Secondary | ICD-10-CM | POA: Diagnosis not present

## 2023-04-20 DIAGNOSIS — R0602 Shortness of breath: Secondary | ICD-10-CM | POA: Diagnosis not present

## 2023-04-20 DIAGNOSIS — Z6839 Body mass index (BMI) 39.0-39.9, adult: Secondary | ICD-10-CM | POA: Diagnosis not present

## 2023-04-20 DIAGNOSIS — R635 Abnormal weight gain: Secondary | ICD-10-CM | POA: Diagnosis not present

## 2023-04-20 DIAGNOSIS — E785 Hyperlipidemia, unspecified: Secondary | ICD-10-CM | POA: Diagnosis not present

## 2023-04-20 DIAGNOSIS — S29011A Strain of muscle and tendon of front wall of thorax, initial encounter: Secondary | ICD-10-CM | POA: Diagnosis not present

## 2023-04-20 DIAGNOSIS — Z3200 Encounter for pregnancy test, result unknown: Secondary | ICD-10-CM | POA: Diagnosis not present

## 2023-04-27 NOTE — Progress Notes (Unsigned)
NEUROLOGY FOLLOW UP OFFICE NOTE  [name] [id number]  Assessment/Plan:   Migraine without aura, without status migrainosus, intractable - aggravated after COVID   Migraine prevention:  Start Aimovig 140mg  every 28 days Migraine rescue:  Try Nurtec.  Has promethazine for nausea if needed. Discontinue OTC NSAIDs and analgesics (Tylenol, Excedrin) Keep headache diary Follow up 4-5 months.       Subjective:  Madison Waters is a 42 year old female with asthma, iron-deficiency anemia, tinnitus and migraines who follows up for migraines.  UPDATE: Started Aimovig.   *** Nurtec *** Intensity:  *** Duration:  *** Frequency:  *** Frequency of abortive medication: *** Current NSAIDS/analgesics:  Excedrin MIgraine, Tylenol Current triptans:  none Current ergotamine:  none Current anti-emetic:  promethazine 25mg  Current muscle relaxants:  none Current Antihypertensive medications:  none Current Antidepressant medications:  duloxetine 60mg  daily, trazodone 100mg  (insomnia) Current Anticonvulsant medications:  none Current anti-CGRP:  Aimovig 140mg , Ubrelvy 100mg  Current Vitamins/Herbal/Supplements:  ferrous sulfate Current Antihistamines/Decongestants:  meclizine, zyrtec, Flonase Other therapy:  ice pack Hormone/birth control:  none       Caffeine:  no Diet:  3 liters of water daily.  Avoids soda, spicy foods, citrus, cut out most sugar, may skip lunch Exercise:  not routine Depression:  stable; Anxiety:  stable Other pain:  joint pain Sleep hygiene:  varies.  May take an hour to fall asleep and may wake up several times during the night.  Wakes up with migraines (3 AM)  HISTORY:  Onset:  42 years old.  Became daily since having COVID in end of August 2023. Location:  varies - temples, behind eyes, back of neck Quality:  initially pressure but progresses to throbbing/pounding Intensity:  Severe Aura:  absent Prodrome:  absent Associated symptoms:  Nausea,  photophobia, phonophobia, scalp tenderness, joint pain, sometimes blurred vision.  She denies associated unilateral numbness or weakness. Duration:  1 to 2 hours if treated early, otherwise all day Frequency:  Prior to August 2023 - 5-10 times a month; Since August 2023 - daily Frequency of abortive medication: Tylenol and/or Excedrin daily Triggers:  menses, change in weather, sleep deprivation, stress Relieving factors:  ice pack Activity:  affects daily activity.   Pulse elevated.  Has been in some pain due to ovarian cyst rupture.     Past NSAIDS/analgesics:  ibuprofen (GI problems) Past abortive triptans:  sumatriptan (helped but causes sweating and skin sensitivity), rizatriptan Past abortive ergotamine:  none Past muscle relaxants:  none Past anti-emetic:  Zofran Past antihypertensive medications: none Past antidepressant medications:  venlafaxine (side effects) Past anticonvulsant medications:  topiramate (adverse reaction) Past anti-CGRP:  Ubrelvy 100mg  (not helpful) Other past therapies:  none    Family history of headache:  unknown    PAST MEDICAL HISTORY: Past Medical History:  Diagnosis Date   Anemia    Asthma    GERD (gastroesophageal reflux disease)    High cholesterol    Migraines     MEDICATIONS: Current Outpatient Medications on File Prior to Visit  Medication Sig Dispense Refill   ADVAIR DISKUS 250-50 MCG/ACT AEPB Inhale 1 puff into the lungs 2 (two) times daily.     albuterol (VENTOLIN HFA) 108 (90 Base) MCG/ACT inhaler Inhale 2 puffs into the lungs every 6 (six) hours as needed.     amoxicillin-clavulanate (AUGMENTIN) 875-125 MG tablet Take 1 tablet by mouth 2 (two) times daily. 14 tablet 0   azithromycin (ZITHROMAX Z-PAK) 250 MG tablet As directed 6 tablet 0  DULoxetine (CYMBALTA) 60 MG capsule Take 60 mg by mouth daily.     Erenumab-aooe (AIMOVIG) 140 MG/ML SOAJ Inject 140 mg into the skin every 28 (twenty-eight) days. 1.12 mL 11   ondansetron  (ZOFRAN) 4 MG tablet Take 1 tablet (4 mg total) by mouth every 8 (eight) hours as needed for nausea or vomiting. 20 tablet 0   pantoprazole (PROTONIX) 40 MG tablet Take 40 mg by mouth 2 (two) times daily.     predniSONE (STERAPRED UNI-PAK 21 TAB) 10 MG (21) TBPK tablet Use as directed 21 tablet 0   promethazine (PHENERGAN) 25 MG tablet SMARTSIG:1 Tablet(s) By Mouth Every 12 Hours PRN     sucralfate (CARAFATE) 1 g tablet Take 1 g by mouth 2 (two) times daily.     SUMAtriptan (IMITREX) 100 MG tablet Take 1 tablet (100 mg total) by mouth as needed for migraine. May repeat in 2 hours if headache persists or recurs.  Maximum 2 tablets in 24 hours. 10 tablet 5   traZODone (DESYREL) 100 MG tablet Take 100 mg by mouth at bedtime.     No current facility-administered medications on file prior to visit.    ALLERGIES: Allergies  Allergen Reactions   Sulfa Antibiotics Hives    FAMILY HISTORY: Family History  Problem Relation Age of Onset   Stroke Mother       Objective:  *** General: No acute distress.  Patient appears ***-groomed.   Head:  Normocephalic/atraumatic Eyes:  Fundi examined but not visualized Neck: supple, no paraspinal tenderness, full range of motion Heart:  Regular rate and rhythm Lungs:  Clear to auscultation bilaterally Back: No paraspinal tenderness Neurological Exam: alert and oriented.  Speech fluent and not dysarthric, language intact.  CN II-XII intact. Bulk and tone normal, muscle strength 5/5 throughout.  Sensation to light touch intact.  Deep tendon reflexes 2+ throughout, toes downgoing.  Finger to nose testing intact.  Gait normal, Romberg negative.   Shon Millet, DO  CC: ***

## 2023-04-28 ENCOUNTER — Ambulatory Visit: Payer: BC Managed Care – PPO | Admitting: Neurology

## 2023-04-28 ENCOUNTER — Encounter: Payer: Self-pay | Admitting: Neurology

## 2023-04-28 ENCOUNTER — Other Ambulatory Visit: Payer: Self-pay | Admitting: Neurology

## 2023-04-28 VITALS — BP 123/84 | HR 96 | Ht 64.0 in | Wt 285.0 lb

## 2023-04-28 DIAGNOSIS — G43009 Migraine without aura, not intractable, without status migrainosus: Secondary | ICD-10-CM | POA: Diagnosis not present

## 2023-04-28 MED ORDER — NURTEC 75 MG PO TBDP: 1.0000 | ORAL_TABLET | Freq: Every day | ORAL | 11 refills | Status: DC | PRN

## 2023-04-28 MED ORDER — AIMOVIG 140 MG/ML ~~LOC~~ SOAJ: 140.0000 mg | SUBCUTANEOUS | 11 refills | Status: DC

## 2023-04-28 MED ORDER — SUMATRIPTAN SUCCINATE 100 MG PO TABS: 100.0000 mg | ORAL_TABLET | ORAL | 5 refills | Status: DC | PRN

## 2023-04-28 NOTE — Patient Instructions (Addendum)
Aimovig every 28 days Will try for Nurtec again.  Otherwise, continue sumatriptan Limit use of pain relievers to no more than 2 days out of week to prevent risk of rebound or medication-overuse headache. Keep headache diary Follow up 9 months.

## 2023-04-29 ENCOUNTER — Telehealth: Payer: Self-pay

## 2023-04-29 NOTE — Telephone Encounter (Signed)
PA needed for Nurtec.   PA team please start PA for Nurtec.

## 2023-05-05 ENCOUNTER — Other Ambulatory Visit (HOSPITAL_COMMUNITY): Payer: Self-pay

## 2023-05-05 ENCOUNTER — Telehealth: Payer: Self-pay

## 2023-05-05 NOTE — Telephone Encounter (Signed)
PA request has been Submitted. New Encounter created for follow up. For additional info see Pharmacy Prior Auth telephone encounter from 08/07.

## 2023-05-05 NOTE — Telephone Encounter (Signed)
*  [facility]  Pharmacy Patient Advocate Encounter   Received notification from Pt Calls Messages that prior authorization for Nurtec 75MG  dispersible tablets  is required/requested.   Insurance verification completed.   The patient is insured through [facility] .   Per test claim: PA required; PA submitted to [employer] via CoverMyMeds Key/confirmation #/EOC [id number] Status is pending

## 2023-05-10 ENCOUNTER — Other Ambulatory Visit (HOSPITAL_COMMUNITY): Payer: Self-pay

## 2023-05-10 NOTE — Telephone Encounter (Signed)
Pharmacy Patient Advocate Encounter  Received notification from [facility] that Prior Authorization for Nurtec 75MG  dispersible tablets has been DENIED. Please advise how you'd like to proceed. Full denial letter will be uploaded to the media tab. See denial reason below.  This medication is not on the formulary. The member must meet the criteria for episodic and acute migraines (as defined in the criteria) to receive more than the plan limit. The allowed amount for acute migraines is 8 pills every 30 days. In this case, the member is using for acute migraine treatment and the request is for 16 tablets per 30 days. Please note: the member meets criteria for this medication within the quantity limit of 8 tablets per 30 days for acute migraine treatment. A separate prior authorization request is required.  PA #/Case ID/Reference #: [id number]

## 2023-05-12 ENCOUNTER — Other Ambulatory Visit: Payer: Self-pay | Admitting: Neurology

## 2023-05-12 MED ORDER — NURTEC 75 MG PO TBDP: 1.0000 | ORAL_TABLET | Freq: Every day | ORAL | 11 refills | Status: DC | PRN

## 2023-05-21 DIAGNOSIS — Z3200 Encounter for pregnancy test, result unknown: Secondary | ICD-10-CM | POA: Diagnosis not present

## 2023-05-21 DIAGNOSIS — Z6838 Body mass index (BMI) 38.0-38.9, adult: Secondary | ICD-10-CM | POA: Diagnosis not present

## 2023-05-21 DIAGNOSIS — E6609 Other obesity due to excess calories: Secondary | ICD-10-CM | POA: Diagnosis not present

## 2023-05-21 DIAGNOSIS — Z833 Family history of diabetes mellitus: Secondary | ICD-10-CM | POA: Diagnosis not present

## 2023-05-28 DIAGNOSIS — Z1231 Encounter for screening mammogram for malignant neoplasm of breast: Secondary | ICD-10-CM | POA: Diagnosis not present

## 2023-05-29 IMAGING — DX DG CHEST 2V
2 series · 2 of 2 positions shown · non-contrast
Comparison: No priors.

CLINICAL DATA: 40-year-old female with history of low-grade fever
and coarseness.

EXAM:
CHEST - 2 VIEW

[dg chest 2 view (1 of 2)]
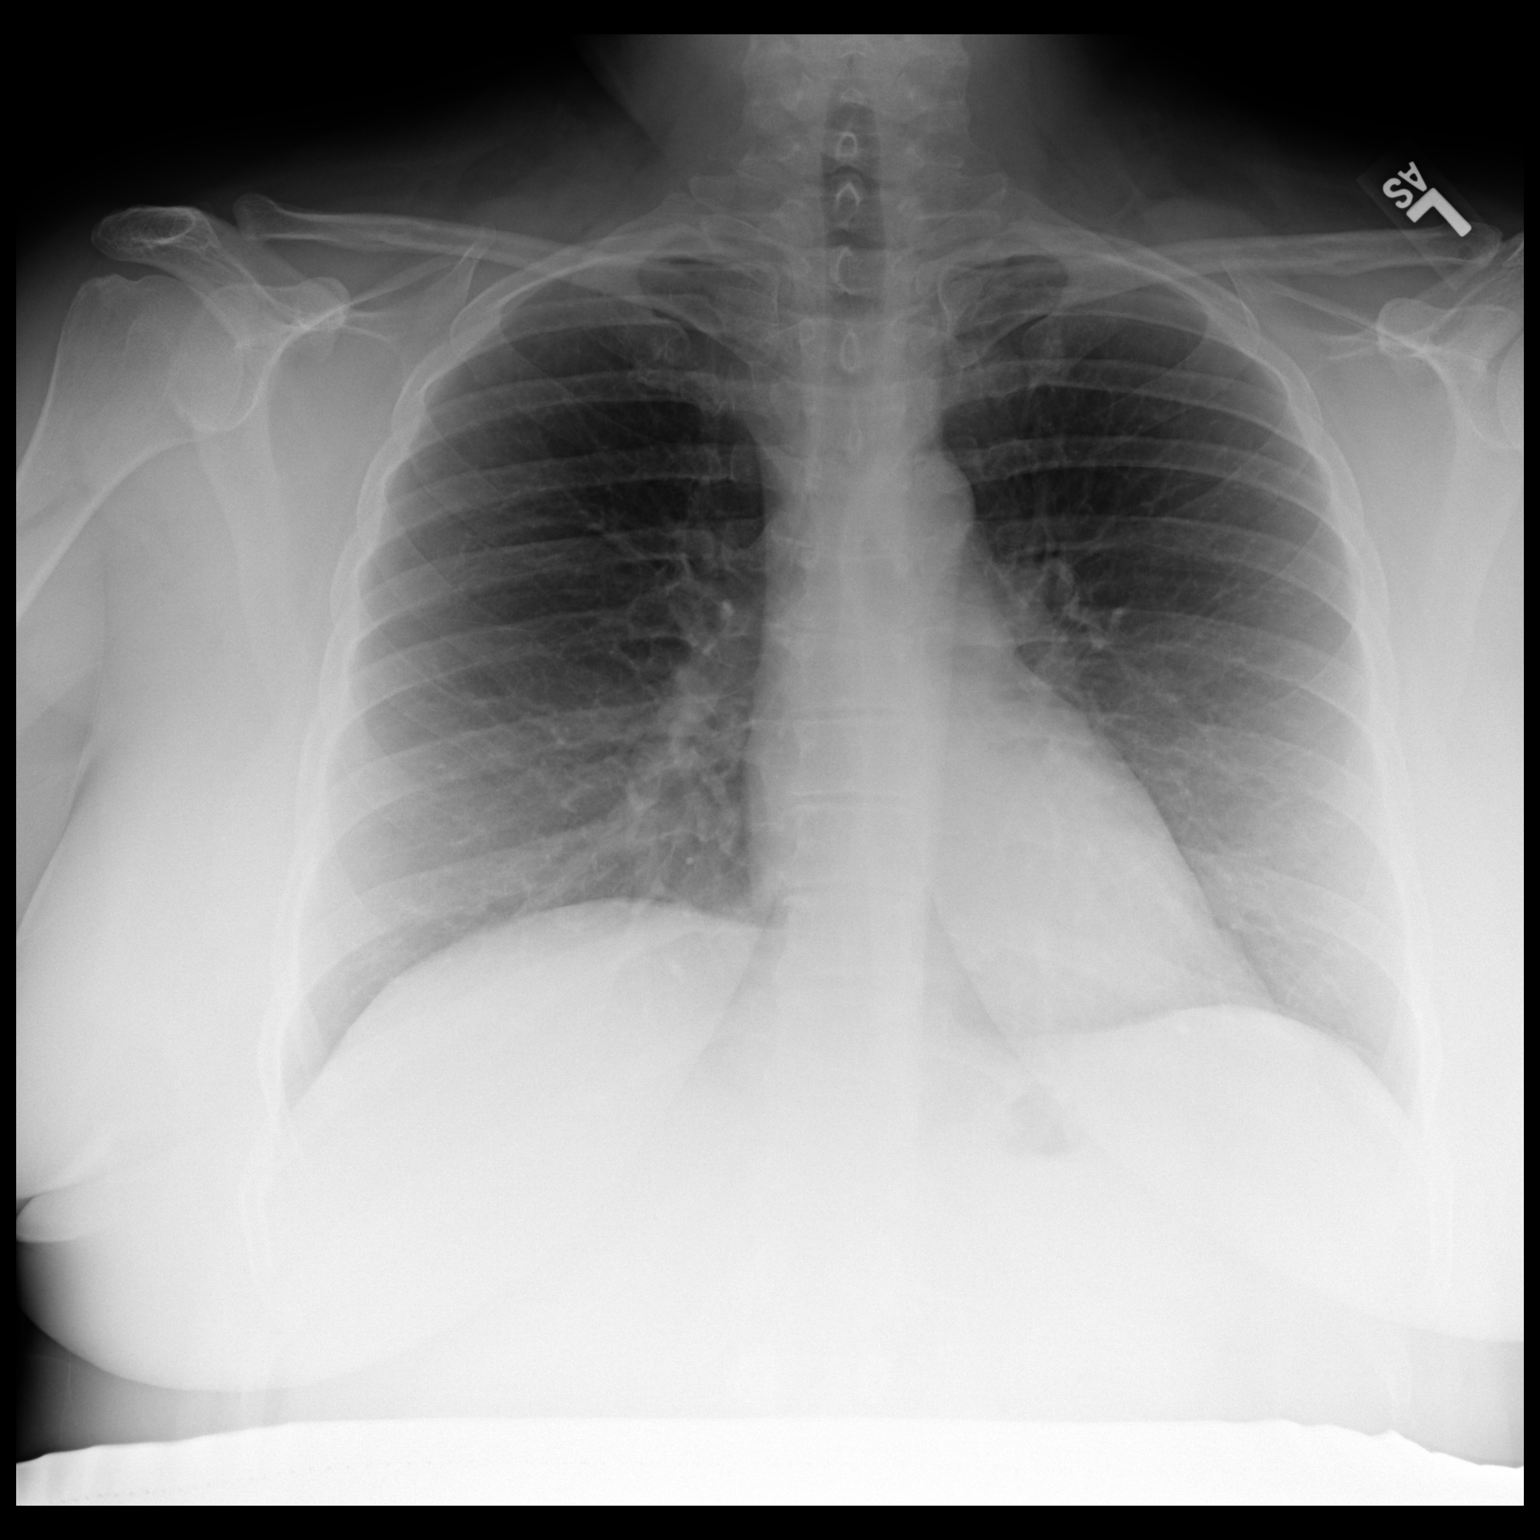

[dg chest 2 view (2 of 2)]
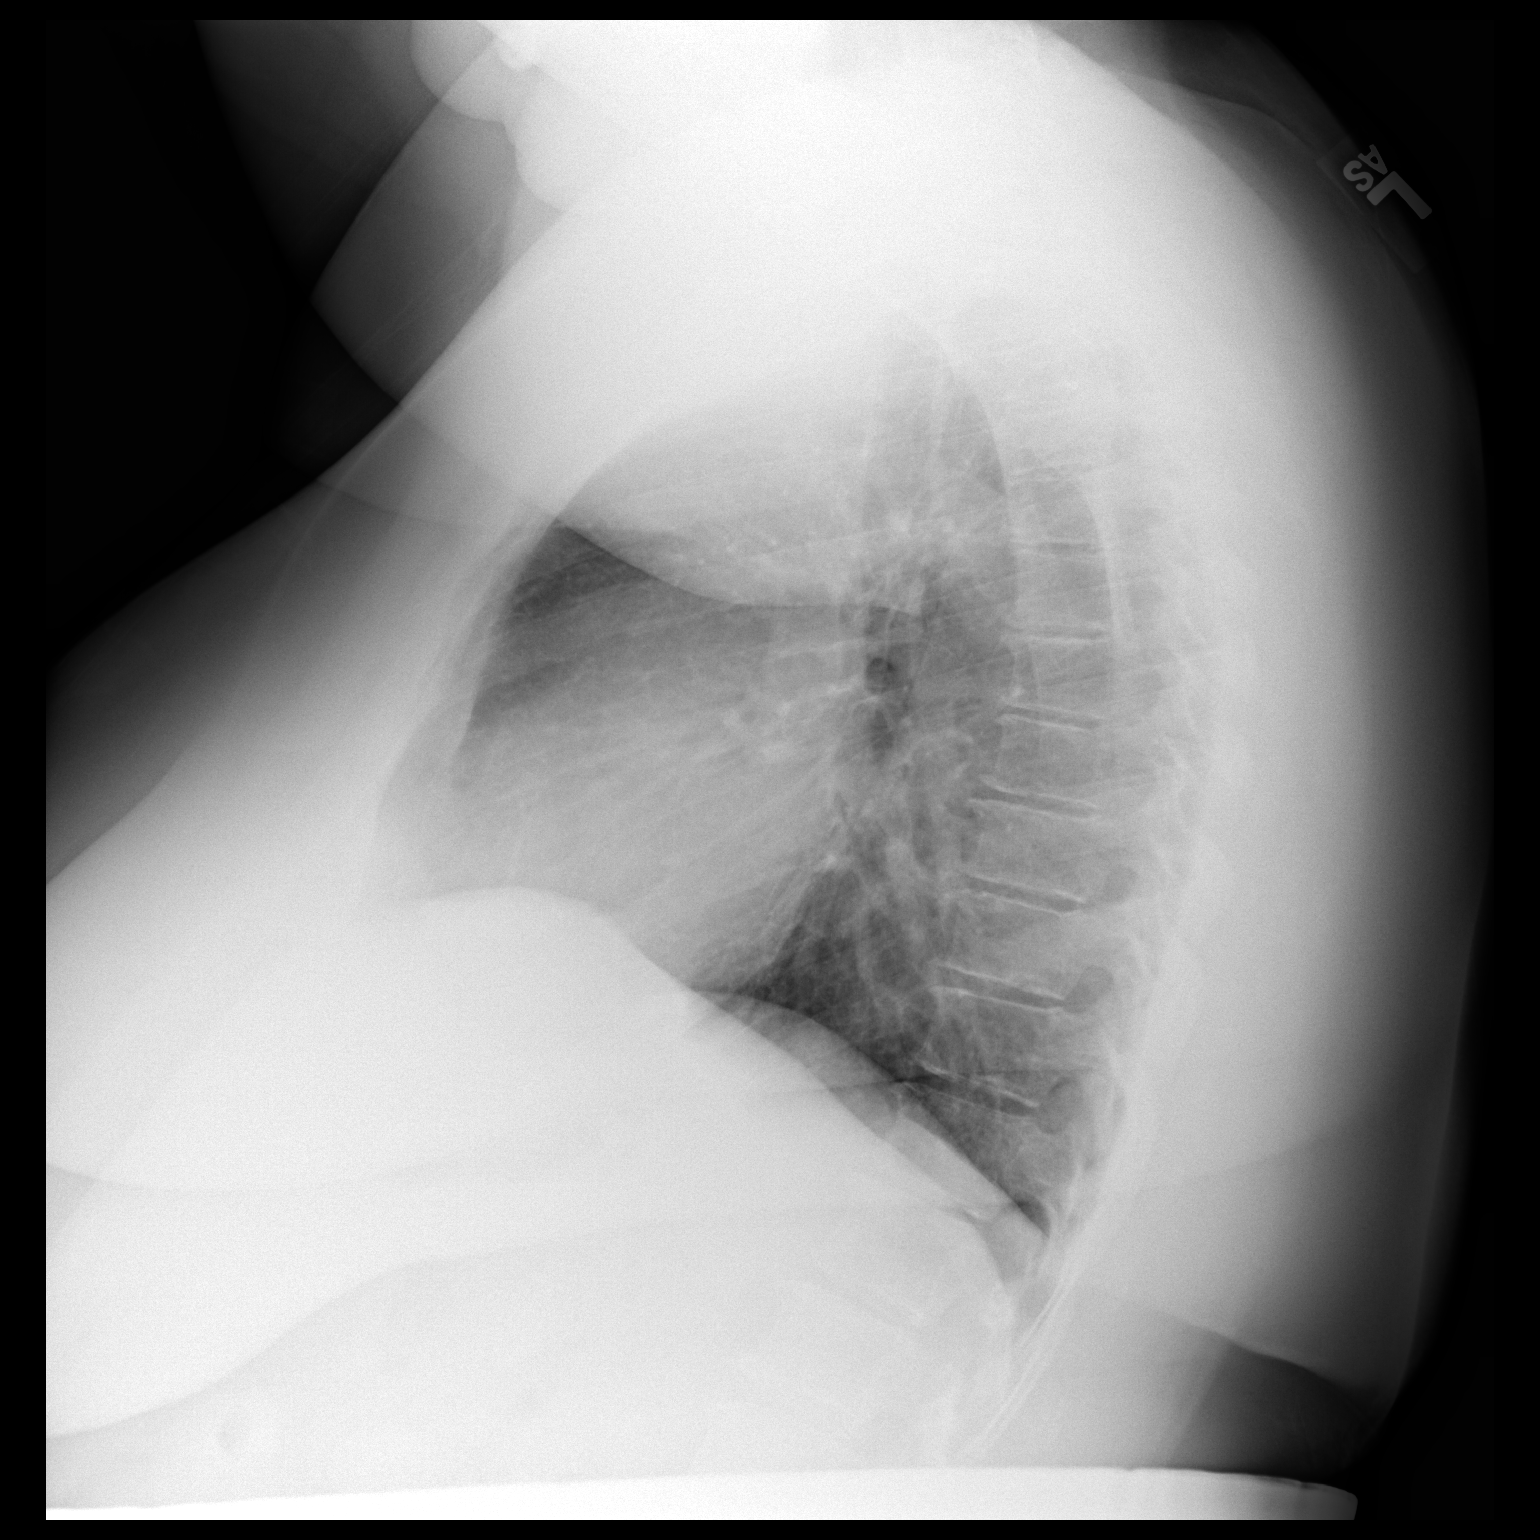

[2 of 2 positions shown; findings below may reference images not displayed]

FINDINGS: Lung volumes are normal. No consolidative airspace disease. No
pleural effusions. No pneumothorax. No pulmonary nodule or mass
noted. Pulmonary vasculature and the cardiomediastinal silhouette
are within normal limits.
IMPRESSION: No radiographic evidence of acute cardiopulmonary disease.

## 2023-06-04 DIAGNOSIS — R928 Other abnormal and inconclusive findings on diagnostic imaging of breast: Secondary | ICD-10-CM | POA: Diagnosis not present

## 2023-06-04 DIAGNOSIS — R92321 Mammographic fibroglandular density, right breast: Secondary | ICD-10-CM | POA: Diagnosis not present

## 2023-06-18 DIAGNOSIS — Z3200 Encounter for pregnancy test, result unknown: Secondary | ICD-10-CM | POA: Diagnosis not present

## 2023-06-18 DIAGNOSIS — Z6841 Body Mass Index (BMI) 40.0 and over, adult: Secondary | ICD-10-CM | POA: Diagnosis not present

## 2023-06-18 DIAGNOSIS — Z833 Family history of diabetes mellitus: Secondary | ICD-10-CM | POA: Diagnosis not present

## 2023-07-09 DIAGNOSIS — M546 Pain in thoracic spine: Secondary | ICD-10-CM | POA: Diagnosis not present

## 2023-07-09 DIAGNOSIS — M25511 Pain in right shoulder: Secondary | ICD-10-CM | POA: Diagnosis not present

## 2023-07-09 DIAGNOSIS — M9901 Segmental and somatic dysfunction of cervical region: Secondary | ICD-10-CM | POA: Diagnosis not present

## 2023-07-09 DIAGNOSIS — M9908 Segmental and somatic dysfunction of rib cage: Secondary | ICD-10-CM | POA: Diagnosis not present

## 2023-07-09 DIAGNOSIS — M9907 Segmental and somatic dysfunction of upper extremity: Secondary | ICD-10-CM | POA: Diagnosis not present

## 2023-07-09 DIAGNOSIS — M9902 Segmental and somatic dysfunction of thoracic region: Secondary | ICD-10-CM | POA: Diagnosis not present

## 2023-07-09 DIAGNOSIS — M542 Cervicalgia: Secondary | ICD-10-CM | POA: Diagnosis not present

## 2023-07-09 DIAGNOSIS — M25512 Pain in left shoulder: Secondary | ICD-10-CM | POA: Diagnosis not present

## 2023-07-19 DIAGNOSIS — Z833 Family history of diabetes mellitus: Secondary | ICD-10-CM | POA: Diagnosis not present

## 2023-07-19 DIAGNOSIS — Z2821 Immunization not carried out because of patient refusal: Secondary | ICD-10-CM | POA: Diagnosis not present

## 2023-07-19 DIAGNOSIS — Z6841 Body Mass Index (BMI) 40.0 and over, adult: Secondary | ICD-10-CM | POA: Diagnosis not present

## 2023-07-22 DIAGNOSIS — M9905 Segmental and somatic dysfunction of pelvic region: Secondary | ICD-10-CM | POA: Diagnosis not present

## 2023-07-22 DIAGNOSIS — M25511 Pain in right shoulder: Secondary | ICD-10-CM | POA: Diagnosis not present

## 2023-07-22 DIAGNOSIS — M9904 Segmental and somatic dysfunction of sacral region: Secondary | ICD-10-CM | POA: Diagnosis not present

## 2023-07-22 DIAGNOSIS — M542 Cervicalgia: Secondary | ICD-10-CM | POA: Diagnosis not present

## 2023-07-22 DIAGNOSIS — M9901 Segmental and somatic dysfunction of cervical region: Secondary | ICD-10-CM | POA: Diagnosis not present

## 2023-07-22 DIAGNOSIS — M25512 Pain in left shoulder: Secondary | ICD-10-CM | POA: Diagnosis not present

## 2023-07-22 DIAGNOSIS — M9907 Segmental and somatic dysfunction of upper extremity: Secondary | ICD-10-CM | POA: Diagnosis not present

## 2023-08-11 DIAGNOSIS — M25512 Pain in left shoulder: Secondary | ICD-10-CM | POA: Diagnosis not present

## 2023-08-11 DIAGNOSIS — G43909 Migraine, unspecified, not intractable, without status migrainosus: Secondary | ICD-10-CM | POA: Diagnosis not present

## 2023-08-11 DIAGNOSIS — M50322 Other cervical disc degeneration at C5-C6 level: Secondary | ICD-10-CM | POA: Diagnosis not present

## 2023-08-11 DIAGNOSIS — M47812 Spondylosis without myelopathy or radiculopathy, cervical region: Secondary | ICD-10-CM | POA: Diagnosis not present

## 2023-08-11 DIAGNOSIS — M4312 Spondylolisthesis, cervical region: Secondary | ICD-10-CM | POA: Diagnosis not present

## 2023-08-11 DIAGNOSIS — Z981 Arthrodesis status: Secondary | ICD-10-CM | POA: Diagnosis not present

## 2023-08-11 DIAGNOSIS — M9901 Segmental and somatic dysfunction of cervical region: Secondary | ICD-10-CM | POA: Diagnosis not present

## 2023-08-11 DIAGNOSIS — M9902 Segmental and somatic dysfunction of thoracic region: Secondary | ICD-10-CM | POA: Diagnosis not present

## 2023-08-11 DIAGNOSIS — M9908 Segmental and somatic dysfunction of rib cage: Secondary | ICD-10-CM | POA: Diagnosis not present

## 2023-08-11 DIAGNOSIS — M9907 Segmental and somatic dysfunction of upper extremity: Secondary | ICD-10-CM | POA: Diagnosis not present

## 2023-08-11 DIAGNOSIS — M542 Cervicalgia: Secondary | ICD-10-CM | POA: Diagnosis not present

## 2023-08-11 DIAGNOSIS — M25532 Pain in left wrist: Secondary | ICD-10-CM | POA: Diagnosis not present

## 2023-08-20 DIAGNOSIS — Z6841 Body Mass Index (BMI) 40.0 and over, adult: Secondary | ICD-10-CM | POA: Diagnosis not present

## 2023-08-20 DIAGNOSIS — R110 Nausea: Secondary | ICD-10-CM | POA: Diagnosis not present

## 2023-08-20 DIAGNOSIS — J4520 Mild intermittent asthma, uncomplicated: Secondary | ICD-10-CM | POA: Diagnosis not present

## 2023-08-20 DIAGNOSIS — M62838 Other muscle spasm: Secondary | ICD-10-CM | POA: Diagnosis not present

## 2023-09-02 DIAGNOSIS — M50322 Other cervical disc degeneration at C5-C6 level: Secondary | ICD-10-CM | POA: Diagnosis not present

## 2023-09-02 DIAGNOSIS — M9902 Segmental and somatic dysfunction of thoracic region: Secondary | ICD-10-CM | POA: Diagnosis not present

## 2023-09-02 DIAGNOSIS — M79671 Pain in right foot: Secondary | ICD-10-CM | POA: Diagnosis not present

## 2023-09-02 DIAGNOSIS — M542 Cervicalgia: Secondary | ICD-10-CM | POA: Diagnosis not present

## 2023-09-02 DIAGNOSIS — G43909 Migraine, unspecified, not intractable, without status migrainosus: Secondary | ICD-10-CM | POA: Diagnosis not present

## 2023-09-02 DIAGNOSIS — M79672 Pain in left foot: Secondary | ICD-10-CM | POA: Diagnosis not present

## 2023-09-02 DIAGNOSIS — R262 Difficulty in walking, not elsewhere classified: Secondary | ICD-10-CM | POA: Diagnosis not present

## 2023-09-02 DIAGNOSIS — M9908 Segmental and somatic dysfunction of rib cage: Secondary | ICD-10-CM | POA: Diagnosis not present

## 2023-09-02 DIAGNOSIS — M9906 Segmental and somatic dysfunction of lower extremity: Secondary | ICD-10-CM | POA: Diagnosis not present

## 2023-09-02 DIAGNOSIS — M9901 Segmental and somatic dysfunction of cervical region: Secondary | ICD-10-CM | POA: Diagnosis not present

## 2023-09-14 DIAGNOSIS — M542 Cervicalgia: Secondary | ICD-10-CM | POA: Diagnosis not present

## 2023-09-14 DIAGNOSIS — M25551 Pain in right hip: Secondary | ICD-10-CM | POA: Diagnosis not present

## 2023-09-14 DIAGNOSIS — M25512 Pain in left shoulder: Secondary | ICD-10-CM | POA: Diagnosis not present

## 2023-09-14 DIAGNOSIS — M25511 Pain in right shoulder: Secondary | ICD-10-CM | POA: Diagnosis not present

## 2023-09-15 ENCOUNTER — Other Ambulatory Visit: Payer: Self-pay | Admitting: Sports Medicine

## 2023-09-15 DIAGNOSIS — R42 Dizziness and giddiness: Secondary | ICD-10-CM

## 2023-09-16 ENCOUNTER — Ambulatory Visit
Admission: RE | Admit: 2023-09-16 | Discharge: 2023-09-16 | Disposition: A | Discharge status: Home / Self Care | Payer: BC Managed Care – PPO | Source: Ambulatory Visit | Attending: Sports Medicine | Admitting: Sports Medicine

## 2023-09-16 DIAGNOSIS — M50222 Other cervical disc displacement at C5-C6 level: Secondary | ICD-10-CM | POA: Diagnosis not present

## 2023-09-16 DIAGNOSIS — Z3200 Encounter for pregnancy test, result unknown: Secondary | ICD-10-CM | POA: Diagnosis not present

## 2023-09-16 DIAGNOSIS — R42 Dizziness and giddiness: Secondary | ICD-10-CM

## 2023-09-16 DIAGNOSIS — Z6841 Body Mass Index (BMI) 40.0 and over, adult: Secondary | ICD-10-CM | POA: Diagnosis not present

## 2023-09-16 DIAGNOSIS — G8929 Other chronic pain: Secondary | ICD-10-CM | POA: Diagnosis not present

## 2023-09-16 DIAGNOSIS — M5489 Other dorsalgia: Secondary | ICD-10-CM | POA: Diagnosis not present

## 2023-09-16 DIAGNOSIS — M47812 Spondylosis without myelopathy or radiculopathy, cervical region: Secondary | ICD-10-CM | POA: Diagnosis not present

## 2023-09-24 DIAGNOSIS — M25511 Pain in right shoulder: Secondary | ICD-10-CM | POA: Diagnosis not present

## 2023-09-24 DIAGNOSIS — M542 Cervicalgia: Secondary | ICD-10-CM | POA: Diagnosis not present

## 2023-09-24 DIAGNOSIS — G43909 Migraine, unspecified, not intractable, without status migrainosus: Secondary | ICD-10-CM | POA: Diagnosis not present

## 2023-09-24 DIAGNOSIS — M25551 Pain in right hip: Secondary | ICD-10-CM | POA: Diagnosis not present

## 2023-09-24 DIAGNOSIS — M9904 Segmental and somatic dysfunction of sacral region: Secondary | ICD-10-CM | POA: Diagnosis not present

## 2023-09-24 DIAGNOSIS — M9906 Segmental and somatic dysfunction of lower extremity: Secondary | ICD-10-CM | POA: Diagnosis not present

## 2023-09-24 DIAGNOSIS — M9905 Segmental and somatic dysfunction of pelvic region: Secondary | ICD-10-CM | POA: Diagnosis not present

## 2023-09-27 DIAGNOSIS — M5002 Cervical disc disorder with myelopathy, mid-cervical region, unspecified level: Secondary | ICD-10-CM | POA: Diagnosis not present

## 2023-09-27 DIAGNOSIS — M130 Polyarthritis, unspecified: Secondary | ICD-10-CM | POA: Diagnosis not present

## 2023-09-27 DIAGNOSIS — E559 Vitamin D deficiency, unspecified: Secondary | ICD-10-CM | POA: Diagnosis not present

## 2023-10-11 DIAGNOSIS — Z6841 Body Mass Index (BMI) 40.0 and over, adult: Secondary | ICD-10-CM | POA: Diagnosis not present

## 2023-10-11 DIAGNOSIS — M791 Myalgia, unspecified site: Secondary | ICD-10-CM | POA: Diagnosis not present

## 2023-10-11 DIAGNOSIS — J069 Acute upper respiratory infection, unspecified: Secondary | ICD-10-CM | POA: Diagnosis not present

## 2023-10-11 DIAGNOSIS — R059 Cough, unspecified: Secondary | ICD-10-CM | POA: Diagnosis not present

## 2023-10-11 DIAGNOSIS — U071 COVID-19: Secondary | ICD-10-CM | POA: Diagnosis not present

## 2023-10-12 ENCOUNTER — Encounter: Payer: Self-pay | Admitting: Neurology

## 2023-10-15 DIAGNOSIS — M4722 Other spondylosis with radiculopathy, cervical region: Secondary | ICD-10-CM | POA: Diagnosis not present

## 2023-10-18 DIAGNOSIS — Z3200 Encounter for pregnancy test, result unknown: Secondary | ICD-10-CM | POA: Diagnosis not present

## 2023-10-18 DIAGNOSIS — R059 Cough, unspecified: Secondary | ICD-10-CM | POA: Diagnosis not present

## 2023-10-18 DIAGNOSIS — Z6841 Body Mass Index (BMI) 40.0 and over, adult: Secondary | ICD-10-CM | POA: Diagnosis not present

## 2023-10-18 DIAGNOSIS — J22 Unspecified acute lower respiratory infection: Secondary | ICD-10-CM | POA: Diagnosis not present

## 2023-10-22 ENCOUNTER — Other Ambulatory Visit: Payer: Self-pay | Admitting: Sports Medicine

## 2023-10-22 DIAGNOSIS — M25551 Pain in right hip: Secondary | ICD-10-CM | POA: Diagnosis not present

## 2023-10-22 DIAGNOSIS — M542 Cervicalgia: Secondary | ICD-10-CM | POA: Diagnosis not present

## 2023-10-22 DIAGNOSIS — M5451 Vertebrogenic low back pain: Secondary | ICD-10-CM | POA: Diagnosis not present

## 2023-10-22 DIAGNOSIS — M546 Pain in thoracic spine: Secondary | ICD-10-CM | POA: Diagnosis not present

## 2023-10-26 ENCOUNTER — Ambulatory Visit
Admission: RE | Admit: 2023-10-26 | Discharge: 2023-10-26 | Disposition: A | Discharge status: Home / Self Care | Payer: BC Managed Care – PPO | Source: Ambulatory Visit | Attending: Sports Medicine | Admitting: Sports Medicine

## 2023-10-26 DIAGNOSIS — G8929 Other chronic pain: Secondary | ICD-10-CM | POA: Diagnosis not present

## 2023-10-26 DIAGNOSIS — M545 Low back pain, unspecified: Secondary | ICD-10-CM | POA: Diagnosis not present

## 2023-10-26 DIAGNOSIS — M25551 Pain in right hip: Secondary | ICD-10-CM

## 2023-10-28 DIAGNOSIS — M4722 Other spondylosis with radiculopathy, cervical region: Secondary | ICD-10-CM | POA: Diagnosis not present

## 2023-11-03 ENCOUNTER — Telehealth: Payer: Self-pay | Admitting: Neurology

## 2023-11-03 NOTE — Telephone Encounter (Signed)
 Pt called in stating her quality of life has gone down significantly in the last 5 months. She has been having back and neck issues that she has been dealing with. She has been having memory problems. Cannot spell words that she has always been able to. She has been also experiencing issues speaking words as well. Dizziness, loss of balance, weakness when she stands. These things have been getting worse. She feels like something extra is going on. She is afraid it could be MS. Wondering if she could get an MRI of her brain?

## 2023-11-04 NOTE — Telephone Encounter (Signed)
 Pt called back and I let her know Dr. Festus Hubert wants to discuss in a visit. The pt is scheduled for 11/09/23

## 2023-11-05 ENCOUNTER — Ambulatory Visit (HOSPITAL_BASED_OUTPATIENT_CLINIC_OR_DEPARTMENT_OTHER): Payer: BC Managed Care – PPO | Attending: Neurological Surgery | Admitting: Physical Therapy

## 2023-11-05 ENCOUNTER — Encounter (HOSPITAL_BASED_OUTPATIENT_CLINIC_OR_DEPARTMENT_OTHER): Payer: Self-pay | Admitting: Physical Therapy

## 2023-11-05 ENCOUNTER — Other Ambulatory Visit: Payer: Self-pay

## 2023-11-05 DIAGNOSIS — M5459 Other low back pain: Secondary | ICD-10-CM | POA: Diagnosis not present

## 2023-11-05 DIAGNOSIS — R2689 Other abnormalities of gait and mobility: Secondary | ICD-10-CM | POA: Insufficient documentation

## 2023-11-05 DIAGNOSIS — M4722 Other spondylosis with radiculopathy, cervical region: Secondary | ICD-10-CM | POA: Insufficient documentation

## 2023-11-05 DIAGNOSIS — M6281 Muscle weakness (generalized): Secondary | ICD-10-CM | POA: Insufficient documentation

## 2023-11-05 NOTE — Therapy (Signed)
 OUTPATIENT PHYSICAL THERAPY CERVICAL EVALUATION   Patient Name: Madison Waters MRN: [id number] DOB:1981-01-23, 43 y.o., female Today's Date: 11/05/2023  END OF SESSION:  PT End of Session - 11/05/23 0812     Visit Number 1    Date for PT Re-Evaluation 01/29/24    Authorization Type BC/BS    PT Start Time 0800    PT Stop Time 0845    PT Time Calculation (min) 45 min    Activity Tolerance Patient tolerated treatment well;Patient limited by pain    Behavior During Therapy WFL for tasks assessed/performed             Past Medical History:  Diagnosis Date   Anemia    Asthma    GERD (gastroesophageal reflux disease)    High cholesterol    Migraines    Past Surgical History:  Procedure Laterality Date   TONSILLECTOMY     WRIST FRACTURE SURGERY Left    There are no active problems to display for this patient.   PCP: Wanda Kraft NP  REFERRING PROVIDER: Dawley, Lani BROCKS, DO   REFERRING DIAG: [phone] (ICD-10-CM) - Cervical spondylosis with radiculopathy   THERAPY DIAG:  Other low back pain  Cervical spondylosis with radiculopathy  Muscle weakness (generalized)  Other abnormalities of gait and mobility  Rationale for Evaluation and Treatment: Rehabilitation  ONSET DATE: exacerbation x 4 months  SUBJECTIVE:                                                                                                                                                                                                         SUBJECTIVE STATEMENT: Pt reports stumbling often.  When I am walking I have to pay attention to my steps. Have a lot of issues on my right side.  Have had injections in c6-c7 but c5 is where I have my issues.  Injections were last week.  I have high fatigue.  Sleep a lot.  My toleration to activity is low.  I have a positive ANA I wonder if I have MS.  Issues have been progressive for many years recently have gotten worse. I cannot  feel my legs walking up steps.   Meds have helped me sleep Hand dominance: Right  PERTINENT HISTORY:  Balance and LBP with radiation into buttock and RLE Hx of PT in past without pain relief  PAIN: cervical and rue Are you having pain? Yes: NPRS scale: 8-9/10 Pain location: right cervical /shoulder arm Pain description: electric pokes, stabbing, vice Aggravating factors: reaching, sitting Relieving factors: sitting, heating pads/ice;  meds  LBP Yes: NPRS scale: 9/10 Pain location: LBP Pain description: vice, numbness Aggravating factors: standing x 5 minutes, walking Relieving factors: leaning over when standing; sitting; meds PRECAUTIONS: Fall  RED FLAGS: None     WEIGHT BEARING RESTRICTIONS: No  FALLS:  Has patient fallen in last 6 months? No  LIVING ENVIRONMENT: Lives with: lives with their spouse Lives in: House/apartment Stairs: Yes: Internal: and external steps; on right going up and External: 4-5 steps; on right going up Has following equipment at home:  need ad for safety.  Presents today with in [facility]  OCCUPATION: nanny 4 hours a week  PLOF: Independent  PATIENT GOALS: increased mobility/walking  NEXT MD VISIT: Next week with neurologist  OBJECTIVE:  Note: Objective measures were completed at Evaluation unless otherwise noted.  DIAGNOSTIC FINDINGS:  IMPRESSION: Mild multilevel degenerative changes of the lumbar spine.  IMPRESSION: 1. Cervical spine degeneration as described. Mild degenerative anterolisthesis adjacent to the C3-4 level where there is incomplete segmentation. 2. C5-6 disc protrusion contacting but not compressing the cord. Diffusely patent foramina.  PATIENT SURVEYS:  ODI:28 x 2=56/45=80% NDI: 30/50  COGNITION: Overall cognitive status: Within functional limits for tasks assessed  SENSATION: WFL  POSTURE: rounded shoulders and increased thoracic kyphosis  PALPATION: Moderate TTP throughout cervical area and traps though paraspinals to glutes R>L   CERVICAL  ROM:   full  UPPER EXTREMITY ROM:  Wfl     UPPER EXTREMITY MMT:  Pain throughout with resistance MMT Right eval Left eval  Shoulder flexion 4- 4  Shoulder extension    Shoulder abduction 4- 4  Shoulder adduction 4- 4  Shoulder extension    Shoulder internal rotation    Shoulder external rotation    Middle trapezius    Lower trapezius    Elbow flexion    Elbow extension    Wrist flexion    Wrist extension    Wrist ulnar deviation    Wrist radial deviation    Wrist pronation    Wrist supination    Grip strength 11kg 2 kg   (Blank rows = not tested)        Hx of left wrist fx  LOWER EXTREMITY MMT:    MMT Right eval Left eval  Hip flexion 39.4 46.2  Hip extension    Hip abduction 17.6 26.2  Hip adduction    Hip internal rotation    Hip external rotation    Knee flexion    Knee extension 13.6 29.0  Ankle dorsiflexion    Ankle plantarflexion    Ankle inversion    Ankle eversion     (Blank rows = not tested)    Lumbar ROM : wfl flex/ext:  Side bending R/L limited by 80% due to pain  FUNCTIONAL TESTS:  5 times sit to stand: 36.32 from bench at [employer] assist Timed up and go (TUG): 21.63 4 stage balance test : 238ftft  Gait: 266ft pushing WC, 1 episode of tripping on right toes recovered indep.  Feet just clearing floor as distance increases.  Requires 10s standing prior to initiating gait  TREATMENT  Eval Selfcare:   Pt instruction on gaining ad for safety with amb.  Suggested a rollator or a bag with wheels along with cane for amb 500 ft to and from setting.  She VU of her high fall risk.   Encouraged completion of walking daily for HEP using AD for safety to progress toleration to activity.  PATIENT EDUCATION:  Education details: Discussed eval findings, rehab rationale, aquatic program progression/POC and pools in area. Patient  is in agreement  Person educated: Patient Education method: Explanation Education comprehension: verbalized understanding  HOME EXERCISE PROGRAM: tba  ASSESSMENT:  CLINICAL IMPRESSION: Patient is a 43 y.o. f who was seen today for physical therapy evaluation and treatment for Cervical and LBP. 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  She reports radicular symptoms through all UE and LE with and without movement including numbness, tingling, and weakness that has been progressing rapidly over the past 4 months but have been present over the last few years . She does not report any changes in vision. She ha had no falls but reports tripping over right foot often.  Objective testing indicates significant weakness throughout. Her functional scores indicate a high fall risk. She reports limitation with all household chores, functional mobility and ADL's.  She has high fatigability requiring naps in middle of day after running any kind of errands but not more than 1 errand at a time.  Pt pain/dysfunction is likely multifactorial. Questionable underlying CNS/autoimmune dysfunction brought up by pt.  Has an appt with a neurologist next week.  She will benefit form skilled PT intervention with plan both aquatic and land based.  Due to insurance co-pays pt may only been seen x once a week.  OBJECTIVE IMPAIRMENTS: Abnormal gait, decreased activity tolerance, decreased balance, decreased endurance, decreased knowledge of use of DME, difficulty walking, decreased strength, decreased safety awareness, impaired UE functional use, postural dysfunction, obesity, and pain.   ACTIVITY LIMITATIONS: carrying, lifting, bending, sitting, standing, squatting, sleeping, stairs, transfers, reach over head, and locomotion level  PARTICIPATION LIMITATIONS: meal prep, cleaning, laundry, driving, shopping, community activity, and occupation  PERSONAL FACTORS: Age, Time since onset  of injury/illness/exacerbation, and 3+ comorbidities: see PmHx  are also affecting patient's functional outcome.   REHAB POTENTIAL: Good  CLINICAL DECISION MAKING: Unstable/unpredictable  EVALUATION COMPLEXITY: High   GOALS: Goals reviewed with patient? Yes  SHORT TERM GOALS: Target date: 3/10  Pt will tolerate full aquatic sessions consistently without increase in pain and with improving function to demonstrate good toleration and effectiveness of intervention.  Baseline:  Goal status: INITIAL  2.  Pt will tolerate walking to and from pool setting (829ft) and tolerate full aquatic sessions without increase in pain or significant fatigue.  Baseline: unable Goal status: INITIAL  3.  Pt will report a reduction in overall pain after aquatic sessions by 50% to demonstrate pain management using the properties of water. Baseline:  Goal status: INITIAL  4.  Pt will report compliance with HEP (walking and assignment of initial land based) Baseline:  Goal status: INITIAL    LONG TERM GOALS: Target date: 01/29/24  Pt will be indep with final HEP's (land and aquatic as appropriate) for continued management of condition Baseline:  Goal status: INITIAL  2.  Pt will report 50% improvement in toleration to driving Baseline:  Goal status: INITIAL  3.  Pt to improve on NDI by at least 5 points (benchmark) and ODI  by 25% to demonstrate statistically significant Improvement in function. Baseline: see chart Goal status: INITIAL  4.  Pt will improve on the by at least 40 ft (MDC) to demonstrate improved functional capacity,endurance and gait speed Baseline: 264ft Goal status: INITIAL  5.  Pt will improve strength in LE by at least 10 lbs and RUE to be equal to contralateral side to demonstrate  improved overall physical function Baseline: see chart Goal status: INITIAL     PLAN:  PT FREQUENCY:1-2 x week  PT DURATION: 12 weeks extended due to scheduling conflicts/insurance  co-pays  PLANNED INTERVENTIONS: 97164- PT Re-evaluation, 97110-Therapeutic exercises, 97530- Therapeutic activity, 97112- Neuromuscular re-education, 97535- Self Care, 02859- Manual therapy, [phone]- Gait training, [phone]- Orthotic Fit/training, [phone]- Aquatic Therapy, [phone]- Ionotophoresis 4mg /ml Dexamethasone, Patient/Family education, Balance training, Stair training, Taping, Dry Needling, Joint mobilization, DME instructions, Cryotherapy, and Moist heat  PLAN FOR NEXT SESSION: Land: Address cervical and ue dysfunction; gait with AD      Aquatics: general strengthening; balance and pain management  Ronal Foots) Nuha Degner MPT 11/05/23 10:12 AM [facility] [address] [place], [place], [address] Phone: [phone]   Fax:  [phone]

## 2023-11-08 NOTE — Progress Notes (Signed)
NEUROLOGY FOLLOW UP OFFICE NOTE  Madison Waters [id number]  Assessment/Plan:   Weakness, neck/back pain, gait instability, memory and speech problems, myalgias, polyarthralgias.  Unclear etiology.  May be more of a systemic process (chronic post viral, autoimmune, vitamin deficiency) but will evaluate for primary neurologic cause as well.   Workup: MRI of brain with and without contrast Labs:  B12, folate, TSH, vit D, ENA panel, CK, aldolase Further recommendations pending results. Follow up in April as scheduled.  Total time spent in chart and face to face with patient:  43 minutes      Subjective:  Madison Waters is a 43 year old female with asthma, iron-deficiency anemia, tinnitus and migraines who follows up for multiple symptoms.  UPDATE: She has been having multiple problems over the past 5 months.  She has been feeling excessive fatigue.  When she stands up, she fells off balance.  She is tripping over herself.  Notes asymmetric right sided weakness.  Arms and hands may start shaking.  She reports urinary urge incontinence and constipation.  Reports increased cognitive difficulty.  She has word-finding difficulty, difficulty remembering how to spell some words.  Trouble multitasking.  She notes sensory changes.  Last 3 fingers of each hand tingles.  Fingers go numb.  Hands feel cold.  Big toe feels numb.  Feet tingle.  Sometimes her fingers and toes become white.  Reports diffuse joint pain and muscle aches.  She has been experiencing neck and back pain.  MRI of of cervical spine on 09/16/2023 personally reviewed revealed mild degenerative changes particularly at C3-4 and C5-6 but no significant stenosis.  X-ray of lumbar spine on 10/26/2023 revealed mild multilevel degenerative changes.  MRI of lumbar spine has been ordered.  Treated with PT as well as epidural injections.  Has been in physical therapy, including aquatic therapy.  ANA was positive (1:80 titer, heterogeneous).  She has  upcoming appointment with rheumatology.  Concerned about MS.  She has been unable to work full-time.  Most family history is unknown as she was adopted.  HISTORY:  Onset:  43 years old.  Became daily since having COVID in end of August 2023. Location:  varies - temples, behind eyes, back of neck Quality:  initially pressure but progresses to throbbing/pounding Intensity:  Severe Aura:  absent Prodrome:  absent Associated symptoms:  Nausea, photophobia, phonophobia, scalp tenderness, joint pain, sometimes blurred vision.  She denies associated unilateral numbness or weakness. Duration:  1 to 2 hours if treated early, otherwise all day Frequency:  Prior to August 2023 - 5-10 times a month; Since August 2023 - daily Frequency of abortive medication: Tylenol and/or Excedrin daily Triggers:  menses, change in weather, sleep deprivation, stress Relieving factors:  ice pack Activity:  affects daily activity.   Pulse elevated.  Has been in some pain due to ovarian cyst rupture.     Past NSAIDS/analgesics:  ibuprofen (GI problems) Past abortive triptans:  rizatriptan Past abortive ergotamine:  none Past muscle relaxants:  none Past anti-emetic:  Zofran Past antihypertensive medications: none Past antidepressant medications:  venlafaxine (side effects) Past anticonvulsant medications:  topiramate (adverse reaction) Past anti-CGRP:  Bernita Raisin 100mg  (not helpful) Other past therapies:  none    Family history of headache:  unknown    PAST MEDICAL HISTORY: Past Medical History:  Diagnosis Date   Anemia    Asthma    GERD (gastroesophageal reflux disease)    High cholesterol    Migraines     MEDICATIONS:  Current Outpatient Medications on File Prior to Visit  Medication Sig Dispense Refill   ADVAIR DISKUS 250-50 MCG/ACT AEPB Inhale 1 puff into the lungs 2 (two) times daily.     albuterol (VENTOLIN HFA) 108 (90 Base) MCG/ACT inhaler Inhale 2 puffs into the lungs every 6 (six) hours as  needed.     DULoxetine (CYMBALTA) 60 MG capsule Take 60 mg by mouth daily.     Erenumab-aooe (AIMOVIG) 140 MG/ML SOAJ Inject 140 mg into the skin every 28 (twenty-eight) days. 1.12 mL 11   ondansetron (ZOFRAN) 4 MG tablet Take 1 tablet (4 mg total) by mouth every 8 (eight) hours as needed for nausea or vomiting. 20 tablet 0   pantoprazole (PROTONIX) 40 MG tablet Take 40 mg by mouth 2 (two) times daily.     promethazine (PHENERGAN) 25 MG tablet SMARTSIG:1 Tablet(s) By Mouth Every 12 Hours PRN     Rimegepant Sulfate (NURTEC) 75 MG TBDP Take 1 tablet (75 mg total) by mouth daily as needed. 8 tablet 11   sucralfate (CARAFATE) 1 g tablet Take 1 g by mouth 2 (two) times daily.     SUMAtriptan (IMITREX) 100 MG tablet Take 1 tablet (100 mg total) by mouth as needed for migraine. May repeat in 2 hours if headache persists or recurs.  Maximum 2 tablets in 24 hours. 10 tablet 5   traZODone (DESYREL) 100 MG tablet Take 100 mg by mouth at bedtime.     No current facility-administered medications on file prior to visit.    ALLERGIES: Allergies  Allergen Reactions   Sulfa Antibiotics Hives    FAMILY HISTORY: Family History  Problem Relation Age of Onset   Stroke Mother       Objective:  Blood pressure (!) 140/86, pulse 98, height 5\' 4"  (1.626 m), weight 261 lb (118.4 kg), SpO2 96%. General: No acute distress.  Patient appears well-groomed.   Head:  Normocephalic/atraumatic Neck:  Supple.  No paraspinal tenderness.  Full range of motion. Heart:  Regular rate and rhythm. Neuro:  Alert and oriented.  Speech fluent and not dysarthric.  Language intact.  CN II-XII intact.  Bulk and tone normal.  Muscle strength 5/5 throughout.  Sensation to pinprick and vibration intact.  Deep tendon reflexes 2+ throughout.  Gait normal.  Romberg negative.   Shon Millet, DO  CC: Kennon Holter, NP

## 2023-11-09 ENCOUNTER — Ambulatory Visit: Payer: BC Managed Care – PPO | Admitting: Neurology

## 2023-11-09 ENCOUNTER — Encounter: Payer: Self-pay | Admitting: Neurology

## 2023-11-09 ENCOUNTER — Other Ambulatory Visit: Payer: BC Managed Care – PPO

## 2023-11-09 VITALS — BP 140/86 | HR 98 | Ht 64.0 in | Wt 261.0 lb

## 2023-11-09 DIAGNOSIS — R413 Other amnesia: Secondary | ICD-10-CM

## 2023-11-09 DIAGNOSIS — D529 Folate deficiency anemia, unspecified: Secondary | ICD-10-CM | POA: Diagnosis not present

## 2023-11-09 DIAGNOSIS — M791 Myalgia, unspecified site: Secondary | ICD-10-CM | POA: Diagnosis not present

## 2023-11-09 DIAGNOSIS — R531 Weakness: Secondary | ICD-10-CM

## 2023-11-09 DIAGNOSIS — R2681 Unsteadiness on feet: Secondary | ICD-10-CM

## 2023-11-09 DIAGNOSIS — R202 Paresthesia of skin: Secondary | ICD-10-CM | POA: Diagnosis not present

## 2023-11-09 NOTE — Patient Instructions (Addendum)
Check MRI of brain with and without contrast Check labs:  B12, folate, vit D, TSH, CK, aldolase, ENA panel Further recommendations pending results Follow up in April as scheduled.

## 2023-11-10 ENCOUNTER — Other Ambulatory Visit: Payer: Self-pay | Admitting: Sports Medicine

## 2023-11-10 ENCOUNTER — Ambulatory Visit (HOSPITAL_BASED_OUTPATIENT_CLINIC_OR_DEPARTMENT_OTHER): Payer: BC Managed Care – PPO | Admitting: Physical Therapy

## 2023-11-10 DIAGNOSIS — R2681 Unsteadiness on feet: Secondary | ICD-10-CM

## 2023-11-10 DIAGNOSIS — M5451 Vertebrogenic low back pain: Secondary | ICD-10-CM

## 2023-11-11 ENCOUNTER — Encounter (HOSPITAL_BASED_OUTPATIENT_CLINIC_OR_DEPARTMENT_OTHER): Payer: Self-pay | Admitting: Physical Therapy

## 2023-11-11 ENCOUNTER — Ambulatory Visit (HOSPITAL_BASED_OUTPATIENT_CLINIC_OR_DEPARTMENT_OTHER): Payer: Self-pay | Admitting: Physical Therapy

## 2023-11-11 DIAGNOSIS — M5459 Other low back pain: Secondary | ICD-10-CM

## 2023-11-11 DIAGNOSIS — M4722 Other spondylosis with radiculopathy, cervical region: Secondary | ICD-10-CM | POA: Diagnosis not present

## 2023-11-11 DIAGNOSIS — M791 Myalgia, unspecified site: Secondary | ICD-10-CM | POA: Diagnosis not present

## 2023-11-11 DIAGNOSIS — R531 Weakness: Secondary | ICD-10-CM | POA: Diagnosis not present

## 2023-11-11 DIAGNOSIS — M6281 Muscle weakness (generalized): Secondary | ICD-10-CM

## 2023-11-11 DIAGNOSIS — R202 Paresthesia of skin: Secondary | ICD-10-CM | POA: Diagnosis not present

## 2023-11-11 DIAGNOSIS — R2689 Other abnormalities of gait and mobility: Secondary | ICD-10-CM

## 2023-11-11 LAB — CK: Total CK: 70 U/L (ref 20–239)

## 2023-11-11 LAB — B12 AND FOLATE PANEL
Folate: 10.8 ng/mL
Vitamin B-12: 308 pg/mL (ref 200–1100)

## 2023-11-11 LAB — ALDOLASE: Aldolase: 5.2 U/L (ref <=8.1)

## 2023-11-11 LAB — TSH: TSH: 2.4 m[IU]/L

## 2023-11-11 NOTE — Therapy (Signed)
 OUTPATIENT PHYSICAL THERAPY CERVICAL/THORACIC TREATMENT   Patient Name: Madison Waters MRN: [id number] DOB:Sep 26, 1981, 43 y.o., female Today's Date: 11/11/2023  END OF SESSION:  PT End of Session - 11/11/23 1110     Visit Number 2    Date for PT Re-Evaluation 01/29/24    Authorization Type BC/BS    PT Start Time 1105    PT Stop Time 1145    PT Time Calculation (min) 40 min    Activity Tolerance Patient tolerated treatment well;Patient limited by pain    Behavior During Therapy WFL for tasks assessed/performed             Past Medical History:  Diagnosis Date   Anemia    Asthma    GERD (gastroesophageal reflux disease)    High cholesterol    Migraines    Past Surgical History:  Procedure Laterality Date   TONSILLECTOMY     WRIST FRACTURE SURGERY Left    There are no active problems to display for this patient.   PCP: Kennon Holter NP  REFERRING PROVIDER: Dawley, Alan Mulder, DO   REFERRING DIAG: [phone] (ICD-10-CM) - Cervical spondylosis with radiculopathy   THERAPY DIAG:  Other low back pain  Cervical spondylosis with radiculopathy  Muscle weakness (generalized)  Other abnormalities of gait and mobility  Rationale for Evaluation and Treatment: Rehabilitation  ONSET DATE: exacerbation x 4 months  SUBJECTIVE:                                                                                                                                                                                                         SUBJECTIVE STATEMENT: I ordered a cane will get it soon.  Saw neurologist he ordered brain MRI and did some blood work.  MRI March.     Initial subjective Pt reports stumbling often.  When I am walking I have to pay attention to my steps. Have a lot of issues on my right side.  Have had injections in c6-c7 but c5 is where I have my issues.  Injections were last week.  I have high fatigue.  Sleep a lot.  My toleration to activity is low.  I have a  positive ANA I wonder if I have MS.  Issues have been progressive for many years recently have gotten worse. I cannot  feel my legs walking up steps.  Meds have helped me sleep Hand dominance: Right  PERTINENT HISTORY:  Balance and LBP with radiation into buttock and RLE Hx of PT in past without pain relief  PAIN: cervical and rue  Are you having pain? Yes: NPRS scale: 7/10 Pain location: right cervical /shoulder arm Pain description: electric pokes, stabbing, vice Aggravating factors: reaching, sitting Relieving factors: sitting, heating pads/ice; meds  LBP Yes: NPRS scale: 7/10 Pain location: LBP Pain description: vice, numbness Aggravating factors: standing x 5 minutes, walking Relieving factors: leaning over when standing; sitting; meds PRECAUTIONS: Fall  RED FLAGS: None     WEIGHT BEARING RESTRICTIONS: No  FALLS:  Has patient fallen in last 6 months? No  LIVING ENVIRONMENT: Lives with: lives with their spouse Lives in: House/apartment Stairs: Yes: Internal: and external steps; on right going up and External: 4-5 steps; on right going up Has following equipment at home:  need ad for safety.  Presents today with in [facility]  OCCUPATION: nanny 4 hours a week  PLOF: Independent  PATIENT GOALS: increased mobility/walking  NEXT MD VISIT: Next week with neurologist  OBJECTIVE:  Note: Objective measures were completed at Evaluation unless otherwise noted.  DIAGNOSTIC FINDINGS:  IMPRESSION: Mild multilevel degenerative changes of the lumbar spine.  IMPRESSION: 1. Cervical spine degeneration as described. Mild degenerative anterolisthesis adjacent to the C3-4 level where there is incomplete segmentation. 2. C5-6 disc protrusion contacting but not compressing the cord. Diffusely patent foramina.  PATIENT SURVEYS:  ODI:28 x 2=56/45=80% NDI: 30/50  COGNITION: Overall cognitive status: Within functional limits for tasks assessed  SENSATION: WFL  POSTURE: rounded  shoulders and increased thoracic kyphosis  PALPATION: Moderate TTP throughout cervical area and traps though paraspinals to glutes R>L   CERVICAL ROM:   full  UPPER EXTREMITY ROM:  Wfl     UPPER EXTREMITY MMT:  Pain throughout with resistance MMT Right eval Left eval  Shoulder flexion 4- 4  Shoulder extension    Shoulder abduction 4- 4  Shoulder adduction 4- 4  Shoulder extension    Shoulder internal rotation    Shoulder external rotation    Middle trapezius    Lower trapezius    Elbow flexion    Elbow extension    Wrist flexion    Wrist extension    Wrist ulnar deviation    Wrist radial deviation    Wrist pronation    Wrist supination    Grip strength 11kg 2 kg   (Blank rows = not tested)        Hx of left wrist fx  LOWER EXTREMITY MMT:    MMT Right eval Left eval  Hip flexion 39.4 46.2  Hip extension    Hip abduction 17.6 26.2  Hip adduction    Hip internal rotation    Hip external rotation    Knee flexion    Knee extension 13.6 29.0  Ankle dorsiflexion    Ankle plantarflexion    Ankle inversion    Ankle eversion     (Blank rows = not tested)    Lumbar ROM : wfl flex/ext:  Side bending R/L limited by 80% due to pain  FUNCTIONAL TESTS:  5 times sit to stand: 36.32 from bench at [employer] assist Timed up and go (TUG): 21.63 4 stage balance test : 255ftft  Gait: 263ft pushing WC, 1 episode of tripping on right toes recovered indep.  Feet just clearing floor as distance increases.  Requires 10s standing prior to initiating gait  TREATMENT  Pt seen for aquatic therapy today.  Treatment took place in water 3.5-4.75 ft in depth at the [facility] pool. Temp of water was 91.  Pt entered/exited the pool via stairs using alternating pattern with hand  rail.  *intro to setting *walking forward, back and side in 3.6-4.71ft unsupported *L stretch x 3; L stretch with tail wagging *straddling noodle: cycling forward x 4 width [place] support HB;  ue support corner wall: hip add/abd; hip flex/ext *Standing 4.0 ft : ue horizontal add/abd in staggered stance; Bow& Arrow (Some discomfort in mid traps); shoulder depression ->pushing rainbow HB down; unilateral ue support wall hip circles cw&ccw x 10; *Farmers carry using RB HB forward and back x 4 width  Pt requires the buoyancy and hydrostatic pressure of water for support, and to offload joints by unweighting joint load by at least 50 % in navel deep water and by at least 75-80% in chest to neck deep water.  Viscosity of the water is needed for resistance of strengthening. Water current perturbations provides challenge to standing balance requiring increased core activation.                                                                                                                         PATIENT EDUCATION:  Education details: Discussed eval findings, rehab rationale, aquatic program progression/POC and pools in area. Patient is in agreement  Person educated: Patient Education method: Explanation Education comprehension: verbalized understanding  HOME EXERCISE PROGRAM: tba  ASSESSMENT:  CLINICAL IMPRESSION: Pt demonstrates safety and independence in aquatic setting with therapsit instructing from deck. She demonstrates confidence in setting, moving throughout all depths easily.  Pt is directed through various movement patterns and trials  in both sitting and standing positions. She reports some tightness in upper and middle traps with exercises.  Cues given for relaxed posture/decreased guarding throughout.  Goals are ongoing.     Initial Impression Patient is a 43 y.o. f who was seen today for physical therapy evaluation and treatment for Cervical and LBP. 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  She reports radicular symptoms through all UE and LE with and without movement including numbness, tingling, and weakness that has been  progressing rapidly over the past 4 months but have been present over the last few years . She does not report any changes in vision. She ha had no falls but reports tripping over right foot often.  Objective testing indicates significant weakness throughout. Her functional scores indicate a high fall risk. She reports limitation with all household chores, functional mobility and ADL's.  She has high fatigability requiring naps in middle of day after running any kind of errands but not more than 1 errand at a time.  Pt pain/dysfunction is likely multifactorial. Questionable underlying CNS/autoimmune dysfunction brought up by pt.  Has an appt with a neurologist next week.  She will benefit form skilled PT intervention with plan both aquatic and land based.  Due to insurance co-pays pt may only been seen x once a week.  OBJECTIVE IMPAIRMENTS: Abnormal gait, decreased activity tolerance, decreased balance, decreased endurance, decreased knowledge of use of DME, difficulty walking, decreased strength, decreased safety awareness, impaired  UE functional use, postural dysfunction, obesity, and pain.   ACTIVITY LIMITATIONS: carrying, lifting, bending, sitting, standing, squatting, sleeping, stairs, transfers, reach over head, and locomotion level  PARTICIPATION LIMITATIONS: meal prep, cleaning, laundry, driving, shopping, community activity, and occupation  PERSONAL FACTORS: Age, Time since onset of injury/illness/exacerbation, and 3+ comorbidities: see PmHx  are also affecting patient's functional outcome.   REHAB POTENTIAL: Good  CLINICAL DECISION MAKING: Unstable/unpredictable  EVALUATION COMPLEXITY: High   GOALS: Goals reviewed with patient? Yes  SHORT TERM GOALS: Target date: 3/10  Pt will tolerate full aquatic sessions consistently without increase in pain and with improving function to demonstrate good toleration and effectiveness of intervention.  Baseline:  Goal status: INITIAL  2.  Pt  will tolerate walking to and from pool setting (831ft) and tolerate full aquatic sessions without increase in pain or significant fatigue.  Baseline: unable Goal status: INITIAL  3.  Pt will report a reduction in overall pain after aquatic sessions by 50% to demonstrate pain management using the properties of water. Baseline:  Goal status: INITIAL  4.  Pt will report compliance with HEP (walking and assignment of initial land based) Baseline:  Goal status: INITIAL    LONG TERM GOALS: Target date: 01/29/24  Pt will be indep with final HEP's (land and aquatic as appropriate) for continued management of condition Baseline:  Goal status: INITIAL  2.  Pt will report 50% improvement in toleration to driving Baseline:  Goal status: INITIAL  3.  Pt to improve on NDI by at least 5 points (benchmark) and ODI  by 25% to demonstrate statistically significant Improvement in function. Baseline: see chart Goal status: INITIAL  4.  Pt will improve on the by at least 40 ft (MDC) to demonstrate improved functional capacity,endurance and gait speed Baseline: 228ft Goal status: INITIAL  5.  Pt will improve strength in LE by at least 10 lbs and RUE to be equal to contralateral side to demonstrate improved overall physical function Baseline: see chart Goal status: INITIAL     PLAN:  PT FREQUENCY:1-2 x week  PT DURATION: 12 weeks extended due to scheduling conflicts/insurance co-pays  PLANNED INTERVENTIONS: 97164- PT Re-evaluation, 97110-Therapeutic exercises, 97530- Therapeutic activity, 97112- Neuromuscular re-education, 97535- Self Care, 16109- Manual therapy, [phone]- Gait training, [phone]- Orthotic Fit/training, [phone]- Aquatic Therapy, [phone]- Ionotophoresis 4mg /ml Dexamethasone, Patient/Family education, Balance training, Stair training, Taping, Dry Needling, Joint mobilization, DME instructions, Cryotherapy, and Moist heat  PLAN FOR NEXT SESSION: Land: Address cervical and ue  dysfunction; gait with AD      Aquatics: general strengthening; balance and pain management  Rushie Chestnut) Sinan Tuch MPT 11/11/23 11:14 AM [facility] [address] [place], [place], [address] Phone: [phone]   Fax:  [phone]

## 2023-11-12 NOTE — Progress Notes (Signed)
Patient advised of her results.

## 2023-11-16 ENCOUNTER — Other Ambulatory Visit: Payer: Self-pay | Admitting: Neurology

## 2023-11-16 ENCOUNTER — Encounter: Payer: Self-pay | Admitting: Neurology

## 2023-11-16 MED ORDER — PROMETHAZINE HCL 25 MG PO TABS: 25.0000 mg | ORAL_TABLET | Freq: Four times a day (QID) | ORAL | 5 refills | Status: AC | PRN

## 2023-11-18 LAB — FANA STAINING PATTERNS: Speckled Pattern: 1:80 {titer}

## 2023-11-18 LAB — ANA+ENA+DNA/DS+SCL 70+SJOSSA/B
ANA Titer 1: POSITIVE — AB
ENA RNP Ab: 0.3 AI (ref 0.0–0.9)
ENA SM Ab Ser-aCnc: <0.2 AI (ref 0.0–0.9)
ENA SSA (RO) Ab: <0.2 AI (ref 0.0–0.9)
ENA SSB (LA) Ab: <0.2 AI (ref 0.0–0.9)
Scleroderma (Scl-70) (ENA) Antibody, IgG: <0.2 AI (ref 0.0–0.9)
dsDNA Ab: <1 [IU]/mL (ref 0–9)

## 2023-11-19 ENCOUNTER — Ambulatory Visit (HOSPITAL_BASED_OUTPATIENT_CLINIC_OR_DEPARTMENT_OTHER): Payer: BC Managed Care – PPO | Admitting: Physical Therapy

## 2023-11-19 DIAGNOSIS — R2689 Other abnormalities of gait and mobility: Secondary | ICD-10-CM | POA: Diagnosis not present

## 2023-11-19 DIAGNOSIS — M4722 Other spondylosis with radiculopathy, cervical region: Secondary | ICD-10-CM | POA: Diagnosis not present

## 2023-11-19 DIAGNOSIS — M6281 Muscle weakness (generalized): Secondary | ICD-10-CM

## 2023-11-19 DIAGNOSIS — M5459 Other low back pain: Secondary | ICD-10-CM | POA: Diagnosis not present

## 2023-11-19 NOTE — Therapy (Signed)
 OUTPATIENT PHYSICAL THERAPY CERVICAL/THORACIC TREATMENT   Patient Name: Madison Waters MRN: [id number] DOB:10/20/1980, 43 y.o., female Today's Date: 11/19/2023  END OF SESSION:  PT End of Session - 11/19/23 1031     Visit Number 3    Date for PT Re-Evaluation 01/29/24    Authorization Type BC/BS    PT Start Time 1018    PT Stop Time 1056    PT Time Calculation (min) 38 min    Activity Tolerance Patient tolerated treatment well    Behavior During Therapy WFL for tasks assessed/performed             Past Medical History:  Diagnosis Date   Anemia    Asthma    GERD (gastroesophageal reflux disease)    High cholesterol    Migraines    Past Surgical History:  Procedure Laterality Date   TONSILLECTOMY     WRIST FRACTURE SURGERY Left    There are no active problems to display for this patient.   PCP: Kennon Holter NP  REFERRING PROVIDER: Dawley, Alan Mulder, DO   REFERRING DIAG: [phone] (ICD-10-CM) - Cervical spondylosis with radiculopathy   THERAPY DIAG:  Other low back pain  Cervical spondylosis with radiculopathy  Muscle weakness (generalized)  Rationale for Evaluation and Treatment: Rehabilitation  ONSET DATE: exacerbation x 4 months  SUBJECTIVE:                                                                                                                                                                                                         SUBJECTIVE STATEMENT: Pt reports she was tired and sore after first session.  "I took a nap for 2 hours afterwards.  The (exercise) soreness lasted a day".     Initial subjective Pt reports stumbling often.  When I am walking I have to pay attention to my steps. Have a lot of issues on my right side.  Have had injections in c6-c7 but c5 is where I have my issues.  Injections were last week.  I have high fatigue.  Sleep a lot.  My toleration to activity is low.  I have a positive ANA I wonder if I have MS.  Issues have  been progressive for many years recently have gotten worse. I cannot  feel my legs walking up steps.  Meds have helped me sleep Hand dominance: Right  PERTINENT HISTORY:  Balance and LBP with radiation into buttock and RLE Hx of PT in past without pain relief  PAIN: cervical and rue Are you having pain? Yes: NPRS scale:  9/10 Pain location: right hip;  area between shoulder blades Pain description: electric pokes, stabbing, vice Aggravating factors: reaching, sitting Relieving factors: sitting, heating pads/ice; meds  PRECAUTIONS: Fall  RED FLAGS: None     WEIGHT BEARING RESTRICTIONS: No  FALLS:  Has patient fallen in last 6 months? No  LIVING ENVIRONMENT: Lives with: lives with their spouse Lives in: House/apartment Stairs: Yes: Internal: and external steps; on right going up and External: 4-5 steps; on right going up Has following equipment at home:  need ad for safety.  Presents today with in [facility]  OCCUPATION: nanny 4 hours a week  PLOF: Independent  PATIENT GOALS: increased mobility/walking  NEXT MD VISIT: Next week with neurologist  OBJECTIVE:  Note: Objective measures were completed at Evaluation unless otherwise noted.  DIAGNOSTIC FINDINGS:  IMPRESSION: Mild multilevel degenerative changes of the lumbar spine.  IMPRESSION: 1. Cervical spine degeneration as described. Mild degenerative anterolisthesis adjacent to the C3-4 level where there is incomplete segmentation. 2. C5-6 disc protrusion contacting but not compressing the cord. Diffusely patent foramina.  PATIENT SURVEYS:  ODI:28 x 2=56/45=80% NDI: 30/50  COGNITION: Overall cognitive status: Within functional limits for tasks assessed  SENSATION: WFL  POSTURE: rounded shoulders and increased thoracic kyphosis  PALPATION: Moderate TTP throughout cervical area and traps though paraspinals to glutes R>L   CERVICAL ROM:   full  UPPER EXTREMITY ROM:  Wfl     UPPER EXTREMITY MMT:  Pain  throughout with resistance MMT Right eval Left eval  Shoulder flexion 4- 4  Shoulder extension    Shoulder abduction 4- 4  Shoulder adduction 4- 4  Shoulder extension    Shoulder internal rotation    Shoulder external rotation    Middle trapezius    Lower trapezius    Elbow flexion    Elbow extension    Wrist flexion    Wrist extension    Wrist ulnar deviation    Wrist radial deviation    Wrist pronation    Wrist supination    Grip strength 11kg 2 kg   (Blank rows = not tested)        Hx of left wrist fx  LOWER EXTREMITY MMT:    MMT Right eval Left eval  Hip flexion 39.4 46.2  Hip extension    Hip abduction 17.6 26.2  Hip adduction    Hip internal rotation    Hip external rotation    Knee flexion    Knee extension 13.6 29.0  Ankle dorsiflexion    Ankle plantarflexion    Ankle inversion    Ankle eversion     (Blank rows = not tested)    Lumbar ROM : wfl flex/ext:  Side bending R/L limited by 80% due to pain  FUNCTIONAL TESTS:  5 times sit to stand: 36.32 from bench at [employer] assist Timed up and go (TUG): 21.63 4 stage balance test : 224ftft  Gait: 269ft pushing WC, 1 episode of tripping on right toes recovered indep.  Feet just clearing floor as distance increases.  Requires 10s standing prior to initiating gait  TREATMENT  Pt seen for aquatic therapy today.  Treatment took place in water 3.5-4.75 ft in depth at the [facility] pool. Temp of water was 91.  Pt entered/exited the pool via stairs using alternating pattern with hand rail.   *unsupported in 42ft water: walking forward and backward with reciprocal arm swing and cues for vertical trunk * walking with reciprocal row motion with rainbow hand floats *  side stepping with arm addct/ abdct with rainbow hand floats * Bow and arrow with step back with rainbow hand floats x 10 each side * Marching with reciprocal row with rainbow hand floats * staggered stance with bilat shoulder horiz  abdct/ addct with rainbow hand floats x 10 each LE forward * staggered stance with tricep press down x 10 each LE  *straddling noodle: cycling forward with out UE support, hip add/abd; hip flex/ext   Pt requires the buoyancy and hydrostatic pressure of water for support, and to offload joints by unweighting joint load by at least 50 % in navel deep water and by at least 75-80% in chest to neck deep water.  Viscosity of the water is needed for resistance of strengthening. Water current perturbations provides challenge to standing balance requiring increased core activation.  PATIENT EDUCATION:  Education details: Intro to aquatic therapy  Person educated: Patient Education method: Explanation Education comprehension: verbalized understanding  HOME EXERCISE PROGRAM: tba  ASSESSMENT:  CLINICAL IMPRESSION: Pt reports some increased discomfort in Rt hip with marching; modified to tolerance.   Cues given throughout for neutral head and vertical posture. Pt reported gradual reduction of pain 2 points by end of session.  Goals are ongoing.     Initial Impression Patient is a 43 y.o. f who was seen today for physical therapy evaluation and treatment for Cervical and LBP. 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  She reports radicular symptoms through all UE and LE with and without movement including numbness, tingling, and weakness that has been progressing rapidly over the past 4 months but have been present over the last few years . She does not report any changes in vision. She ha had no falls but reports tripping over right foot often.  Objective testing indicates significant weakness throughout. Her functional scores indicate a high fall risk. She reports limitation with all household chores, functional mobility and ADL's.  She has high fatigability requiring naps in middle of day after running any kind of errands but not more than 1 errand at a time.   Pt pain/dysfunction is likely multifactorial. Questionable underlying CNS/autoimmune dysfunction brought up by pt.  Has an appt with a neurologist next week.  She will benefit form skilled PT intervention with plan both aquatic and land based.  Due to insurance co-pays pt may only been seen x once a week.  OBJECTIVE IMPAIRMENTS: Abnormal gait, decreased activity tolerance, decreased balance, decreased endurance, decreased knowledge of use of DME, difficulty walking, decreased strength, decreased safety awareness, impaired UE functional use, postural dysfunction, obesity, and pain.   ACTIVITY LIMITATIONS: carrying, lifting, bending, sitting, standing, squatting, sleeping, stairs, transfers, reach over head, and locomotion level  PARTICIPATION LIMITATIONS: meal prep, cleaning, laundry, driving, shopping, community activity, and occupation  PERSONAL FACTORS: Age, Time since onset of injury/illness/exacerbation, and 3+ comorbidities: see PmHx  are also affecting patient's functional outcome.   REHAB POTENTIAL: Good  CLINICAL DECISION MAKING: Unstable/unpredictable  EVALUATION COMPLEXITY: High   GOALS: Goals reviewed with patient? Yes  SHORT TERM GOALS: Target date: 3/10  Pt will tolerate full aquatic sessions consistently without increase in pain and with improving function to demonstrate good toleration and effectiveness of intervention.  Baseline:  Goal status: INITIAL  2.  Pt will tolerate walking to and from pool setting (818ft) and tolerate full aquatic sessions without increase in pain or significant fatigue.  Baseline: unable Goal status: INITIAL  3.  Pt will report a reduction in  overall pain after aquatic sessions by 50% to demonstrate pain management using the properties of water. Baseline:  Goal status: INITIAL  4.  Pt will report compliance with HEP (walking and assignment of initial land based) Baseline:  Goal status: INITIAL    LONG TERM GOALS: Target date:  01/29/24  Pt will be indep with final HEP's (land and aquatic as appropriate) for continued management of condition Baseline:  Goal status: INITIAL  2.  Pt will report 50% improvement in toleration to driving Baseline:  Goal status: INITIAL  3.  Pt to improve on NDI by at least 5 points (benchmark) and ODI  by 25% to demonstrate statistically significant Improvement in function. Baseline: see chart Goal status: INITIAL  4.  Pt will improve on the by at least 40 ft (MDC) to demonstrate improved functional capacity,endurance and gait speed Baseline: 280ft Goal status: INITIAL  5.  Pt will improve strength in LE by at least 10 lbs and RUE to be equal to contralateral side to demonstrate improved overall physical function Baseline: see chart Goal status: INITIAL     PLAN:  PT FREQUENCY:1-2 x week  PT DURATION: 12 weeks extended due to scheduling conflicts/insurance co-pays  PLANNED INTERVENTIONS: 97164- PT Re-evaluation, 97110-Therapeutic exercises, 97530- Therapeutic activity, 97112- Neuromuscular re-education, 97535- Self Care, 29562- Manual therapy, [phone]- Gait training, [phone]- Orthotic Fit/training, [phone]- Aquatic Therapy, [phone]- Ionotophoresis 4mg /ml Dexamethasone, Patient/Family education, Balance training, Stair training, Taping, Dry Needling, Joint mobilization, DME instructions, Cryotherapy, and Moist heat  PLAN FOR NEXT SESSION: Land: Address cervical and ue dysfunction; gait with AD      Aquatics: general strengthening; balance and pain management     Mayer Camel, PTA 11/19/23 5:25 PM [facility] [address] [place], [place], [address] Phone: [phone]   Fax:  [phone]

## 2023-11-19 NOTE — Progress Notes (Signed)
 Patient advised.

## 2023-11-22 ENCOUNTER — Encounter: Payer: Self-pay | Admitting: Neurology

## 2023-11-22 DIAGNOSIS — Z6841 Body Mass Index (BMI) 40.0 and over, adult: Secondary | ICD-10-CM | POA: Diagnosis not present

## 2023-11-22 DIAGNOSIS — M4722 Other spondylosis with radiculopathy, cervical region: Secondary | ICD-10-CM | POA: Diagnosis not present

## 2023-11-24 ENCOUNTER — Encounter (HOSPITAL_BASED_OUTPATIENT_CLINIC_OR_DEPARTMENT_OTHER): Payer: Self-pay | Admitting: Physical Therapy

## 2023-11-24 ENCOUNTER — Ambulatory Visit (HOSPITAL_BASED_OUTPATIENT_CLINIC_OR_DEPARTMENT_OTHER): Payer: BC Managed Care – PPO | Admitting: Physical Therapy

## 2023-11-24 DIAGNOSIS — R2689 Other abnormalities of gait and mobility: Secondary | ICD-10-CM

## 2023-11-24 DIAGNOSIS — M6281 Muscle weakness (generalized): Secondary | ICD-10-CM | POA: Diagnosis not present

## 2023-11-24 DIAGNOSIS — M4722 Other spondylosis with radiculopathy, cervical region: Secondary | ICD-10-CM | POA: Diagnosis not present

## 2023-11-24 DIAGNOSIS — M5459 Other low back pain: Secondary | ICD-10-CM | POA: Diagnosis not present

## 2023-11-24 NOTE — Therapy (Signed)
 OUTPATIENT PHYSICAL THERAPY CERVICAL/THORACIC TREATMENT   Patient Name: Madison Waters MRN: [id number] DOB:Jan 29, 1981, 43 y.o., female Today's Date: 11/24/2023  END OF SESSION:  PT End of Session - 11/24/23 1012     Visit Number 4    Date for PT Re-Evaluation 01/29/24    Authorization Type BC/BS    PT Start Time 0720    PT Stop Time 0759    PT Time Calculation (min) 39 min    Activity Tolerance Patient tolerated treatment well    Behavior During Therapy WFL for tasks assessed/performed              Past Medical History:  Diagnosis Date   Anemia    Asthma    GERD (gastroesophageal reflux disease)    High cholesterol    Migraines    Past Surgical History:  Procedure Laterality Date   TONSILLECTOMY     WRIST FRACTURE SURGERY Left    There are no active problems to display for this patient.   PCP: Kennon Holter NP  REFERRING PROVIDER: Dawley, Alan Mulder, DO   REFERRING DIAG: [phone] (ICD-10-CM) - Cervical spondylosis with radiculopathy   THERAPY DIAG:  Other low back pain  Cervical spondylosis with radiculopathy  Muscle weakness (generalized)  Other abnormalities of gait and mobility  Rationale for Evaluation and Treatment: Rehabilitation  ONSET DATE: exacerbation x 4 months  SUBJECTIVE:                                                                                                                                                                                                         SUBJECTIVE STATEMENT: "I have a 40$ copay. Its tough"  No change in pain, feel a little better after aquatics.  MD just wants me to continue with therapy   Initial subjective Pt reports stumbling often.  When I am walking I have to pay attention to my steps. Have a lot of issues on my right side.  Have had injections in c6-c7 but c5 is where I have my issues.  Injections were last week.  I have high fatigue.  Sleep a lot.  My toleration to activity is low.  I have a positive  ANA I wonder if I have MS.  Issues have been progressive for many years recently have gotten worse. I cannot  feel my legs walking up steps.  Meds have helped me sleep Hand dominance: Right  PERTINENT HISTORY:  Balance and LBP with radiation into buttock and RLE Hx of PT in past without pain relief  PAIN: cervical and rue  Are you having pain? Yes: NPRS scale: 9/10 Pain location: right hip;  area between shoulder blades Pain description: electric pokes, stabbing, vice Aggravating factors: reaching, sitting Relieving factors: sitting, heating pads/ice; meds  PRECAUTIONS: Fall  RED FLAGS: None     WEIGHT BEARING RESTRICTIONS: No  FALLS:  Has patient fallen in last 6 months? No  LIVING ENVIRONMENT: Lives with: lives with their spouse Lives in: House/apartment Stairs: Yes: Internal: and external steps; on right going up and External: 4-5 steps; on right going up Has following equipment at home:  need ad for safety.  Presents today with in [facility]  OCCUPATION: nanny 4 hours a week  PLOF: Independent  PATIENT GOALS: increased mobility/walking  NEXT MD VISIT: Next week with neurologist  OBJECTIVE:  Note: Objective measures were completed at Evaluation unless otherwise noted.  DIAGNOSTIC FINDINGS:  IMPRESSION: Mild multilevel degenerative changes of the lumbar spine.  IMPRESSION: 1. Cervical spine degeneration as described. Mild degenerative anterolisthesis adjacent to the C3-4 level where there is incomplete segmentation. 2. C5-6 disc protrusion contacting but not compressing the cord. Diffusely patent foramina.  PATIENT SURVEYS:  ODI:28 x 2=56/45=80% NDI: 30/50  COGNITION: Overall cognitive status: Within functional limits for tasks assessed  SENSATION: WFL  POSTURE: rounded shoulders and increased thoracic kyphosis  PALPATION: Moderate TTP throughout cervical area and traps though paraspinals to glutes R>L   CERVICAL ROM:   full  UPPER EXTREMITY  ROM:  Wfl     UPPER EXTREMITY MMT:  Pain throughout with resistance MMT Right eval Left eval  Shoulder flexion 4- 4  Shoulder extension    Shoulder abduction 4- 4  Shoulder adduction 4- 4  Shoulder extension    Shoulder internal rotation    Shoulder external rotation    Middle trapezius    Lower trapezius    Elbow flexion    Elbow extension    Wrist flexion    Wrist extension    Wrist ulnar deviation    Wrist radial deviation    Wrist pronation    Wrist supination    Grip strength 11kg 2 kg   (Blank rows = not tested)        Hx of left wrist fx  LOWER EXTREMITY MMT:    MMT Right eval Left eval  Hip flexion 39.4 46.2  Hip extension    Hip abduction 17.6 26.2  Hip adduction    Hip internal rotation    Hip external rotation    Knee flexion    Knee extension 13.6 29.0  Ankle dorsiflexion    Ankle plantarflexion    Ankle inversion    Ankle eversion     (Blank rows = not tested)    Lumbar ROM : wfl flex/ext:  Side bending R/L limited by 80% due to pain  FUNCTIONAL TESTS:  5 times sit to stand: 36.32 from bench at [employer] assist Timed up and go (TUG): 21.63 4 stage balance test : 236ftft  Gait: 271ft pushing WC, 1 episode of tripping on right toes recovered indep.  Feet just clearing floor as distance increases.  Requires 10s standing prior to initiating gait  TREATMENT  Pt seen for aquatic therapy today.  Treatment took place in water 3.5-4.75 ft in depth at the [facility] pool. Temp of water was 91.  Pt entered/exited the pool via stairs using alternating pattern with hand rail.  Exercises - Hand Buoy Carry  - Side Stepping with Hand Floats - Standing Shoulder Horizontal Abduction with Resistance   -  Drawing [employer]  - Standing 'L' Geneticist, molecular at [employer]   - Kick Board Row   - Seated Straddle on [employer] Breast Stroke Arms and Bicycle Legs  *unsupported in 64ft water: walking forward and backward with reciprocal arm swing and cues  for vertical trunk    Pt requires the buoyancy and hydrostatic pressure of water for support, and to offload joints by unweighting joint load by at least 50 % in navel deep water and by at least 75-80% in chest to neck deep water.  Viscosity of the water is needed for resistance of strengthening. Water current perturbations provides challenge to standing balance requiring increased core activation.  PATIENT EDUCATION:  Education details: Intro to aquatic therapy  Person educated: Patient Education method: Explanation Education comprehension: verbalized understanding  HOME EXERCISE PROGRAM: Access Code: [email or web address] URL: [email or web address] Date: 11/24/2023 Prepared by: Geni Bers This aquatic home exercise program from Med[employer] utilizes pictures from land based exercises, but has been adapted prior to lamination and issuance.   NOT ISSUED  Exercises - Hand Buoy Carry  - 1 x daily - 1-3 x weekly - Side Stepping with Hand Floats  - 1 x daily - 1-3 x weekly - Standing Shoulder Horizontal Abduction with Resistance  - 1 x daily - 1-3 x weekly - 1-2 sets - 10 reps - Drawing [place]  - 1 x daily - 1-3 x weekly - 1-2 sets - 10 reps - Standing 'L' Stretch at [employer]  - 1 x daily - 1-3 x weekly - 1 sets - 3 reps - Kick Board Row  - 1 x daily - 1-3 x weekly - 1-2 sets - 10-15 reps - Seated Straddle on Flotation Forward Breast Stroke Arms and Bicycle Legs  - 1 x daily - 1-3 x weekly  ASSESSMENT:  CLINICAL IMPRESSION: Pt with high copay, becoming a burden. She reports some reduction in pain with aquatic intervention.  Discussed (issued handout) pool access for indep completion of exercises.  She completes session with good tolerance to progression.  No difficulty with amb distance to and from parking lot (fatigue or pain).  Pt instruction on initiation of final aquatic HEP with intent to see her x 1-2 more in pool as she will be able to complete indep. Will be cost effective to get  membership to gym/pool and complete HEPs indep.  She is in agreement.  Will plan also on 1-2 land appointments for assignment of land based HEP with instructions on indep progression.    Initial Impression Patient is a 43 y.o. f who was seen today for physical therapy evaluation and treatment for Cervical and LBP. 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  She reports radicular symptoms through all UE and LE with and without movement including numbness, tingling, and weakness that has been progressing rapidly over the past 4 months but have been present over the last few years . She does not report any changes in vision. She ha had no falls but reports tripping over right foot often.  Objective testing indicates significant weakness throughout. Her functional scores indicate a high fall risk. She reports limitation with all household chores, functional mobility and ADL's.  She has high fatigability requiring naps in middle of day after running any kind of errands but not more than 1 errand at a time.  Pt pain/dysfunction is likely multifactorial. Questionable underlying CNS/autoimmune dysfunction brought up by pt.  Has an appt with a neurologist next week.  She will benefit form skilled PT intervention with plan both aquatic and land based.  Due to insurance co-pays pt may only been seen x once a week.  OBJECTIVE IMPAIRMENTS: Abnormal gait, decreased activity tolerance, decreased balance, decreased endurance, decreased knowledge of use of DME, difficulty walking, decreased strength, decreased safety awareness, impaired UE functional use, postural dysfunction, obesity, and pain.   ACTIVITY LIMITATIONS: carrying, lifting, bending, sitting, standing, squatting, sleeping, stairs, transfers, reach over head, and locomotion level  PARTICIPATION LIMITATIONS: meal prep, cleaning, laundry, driving, shopping, community activity, and occupation  PERSONAL FACTORS: Age,  Time since onset of injury/illness/exacerbation, and 3+ comorbidities: see PmHx  are also affecting patient's functional outcome.   REHAB POTENTIAL: Good  CLINICAL DECISION MAKING: Unstable/unpredictable  EVALUATION COMPLEXITY: High   GOALS: Goals reviewed with patient? Yes  SHORT TERM GOALS: Target date: 3/10  Pt will tolerate full aquatic sessions consistently without increase in pain and with improving function to demonstrate good toleration and effectiveness of intervention.  Baseline:  Goal status:Met 11/24/23  2.  Pt will tolerate walking to and from pool setting (865ft) and tolerate full aquatic sessions without increase in pain or significant fatigue.  Baseline: unable Goal status: Met 11/24/23  3.  Pt will report a reduction in overall pain after aquatic sessions by 50% to demonstrate pain management using the properties of water. Baseline:  Goal status: INITIAL  4.  Pt will report compliance with HEP (walking and assignment of initial land based) Baseline:  Goal status: INITIAL    LONG TERM GOALS: Target date: 01/29/24  Pt will be indep with final HEP's (land and aquatic as appropriate) for continued management of condition Baseline:  Goal status: INITIAL  2.  Pt will report 50% improvement in toleration to driving Baseline:  Goal status: INITIAL  3.  Pt to improve on NDI by at least 5 points (benchmark) and ODI  by 25% to demonstrate statistically significant Improvement in function. Baseline: see chart Goal status: INITIAL  4.  Pt will improve on the by at least 40 ft (MDC) to demonstrate improved functional capacity,endurance and gait speed Baseline: 276ft Goal status: INITIAL  5.  Pt will improve strength in LE by at least 10 lbs and RUE to be equal to contralateral side to demonstrate improved overall physical function Baseline: see chart Goal status: INITIAL     PLAN:  PT FREQUENCY:1-2 x week  PT DURATION: 12 weeks extended due to  scheduling conflicts/insurance co-pays  PLANNED INTERVENTIONS: 97164- PT Re-evaluation, 97110-Therapeutic exercises, 97530- Therapeutic activity, 97112- Neuromuscular re-education, 97535- Self Care, 40102- Manual therapy, [phone]- Gait training, [phone]- Orthotic Fit/training, [phone]- Aquatic Therapy, [phone]- Ionotophoresis 4mg /ml Dexamethasone, Patient/Family education, Balance training, Stair training, Taping, Dry Needling, Joint mobilization, DME instructions, Cryotherapy, and Moist heat  PLAN FOR NEXT SESSION: Land: Address cervical and ue dysfunction; gait with AD      Aquatics: general strengthening; balance and pain management     Rushie Chestnut) Tahiri Shareef MPT 11/24/23 10:29 AM [facility] [address] [place], [place], [address] Phone: [phone]   Fax:  [phone]

## 2023-11-26 DIAGNOSIS — Z3200 Encounter for pregnancy test, result unknown: Secondary | ICD-10-CM | POA: Diagnosis not present

## 2023-11-26 DIAGNOSIS — Z6841 Body Mass Index (BMI) 40.0 and over, adult: Secondary | ICD-10-CM | POA: Diagnosis not present

## 2023-11-26 DIAGNOSIS — Z833 Family history of diabetes mellitus: Secondary | ICD-10-CM | POA: Diagnosis not present

## 2023-11-26 DIAGNOSIS — D539 Nutritional anemia, unspecified: Secondary | ICD-10-CM | POA: Diagnosis not present

## 2023-11-27 ENCOUNTER — Ambulatory Visit
Admission: RE | Admit: 2023-11-27 | Discharge: 2023-11-27 | Disposition: A | Discharge status: Home / Self Care | Payer: BC Managed Care – PPO | Source: Ambulatory Visit | Attending: Sports Medicine | Admitting: Sports Medicine

## 2023-11-27 ENCOUNTER — Encounter (HOSPITAL_BASED_OUTPATIENT_CLINIC_OR_DEPARTMENT_OTHER): Payer: BC Managed Care – PPO | Admitting: Rehabilitative and Restorative Service Providers"

## 2023-11-27 DIAGNOSIS — R2681 Unsteadiness on feet: Secondary | ICD-10-CM

## 2023-11-27 DIAGNOSIS — M5451 Vertebrogenic low back pain: Secondary | ICD-10-CM

## 2023-11-27 DIAGNOSIS — M5116 Intervertebral disc disorders with radiculopathy, lumbar region: Secondary | ICD-10-CM | POA: Diagnosis not present

## 2023-12-01 ENCOUNTER — Ambulatory Visit (HOSPITAL_BASED_OUTPATIENT_CLINIC_OR_DEPARTMENT_OTHER): Payer: Self-pay | Admitting: Physical Therapy

## 2023-12-01 ENCOUNTER — Encounter (HOSPITAL_BASED_OUTPATIENT_CLINIC_OR_DEPARTMENT_OTHER): Payer: Self-pay | Admitting: Physical Therapy

## 2023-12-01 ENCOUNTER — Ambulatory Visit (HOSPITAL_BASED_OUTPATIENT_CLINIC_OR_DEPARTMENT_OTHER): Payer: BC Managed Care – PPO | Admitting: Physical Therapy

## 2023-12-03 ENCOUNTER — Encounter (HOSPITAL_BASED_OUTPATIENT_CLINIC_OR_DEPARTMENT_OTHER): Payer: BC Managed Care – PPO

## 2023-12-06 ENCOUNTER — Encounter: Payer: Self-pay | Admitting: Neurology

## 2023-12-08 ENCOUNTER — Ambulatory Visit (HOSPITAL_BASED_OUTPATIENT_CLINIC_OR_DEPARTMENT_OTHER): Payer: BC Managed Care – PPO | Admitting: Physical Therapy

## 2023-12-08 DIAGNOSIS — M549 Dorsalgia, unspecified: Secondary | ICD-10-CM | POA: Diagnosis not present

## 2023-12-08 DIAGNOSIS — M2550 Pain in unspecified joint: Secondary | ICD-10-CM | POA: Diagnosis not present

## 2023-12-08 DIAGNOSIS — M79671 Pain in right foot: Secondary | ICD-10-CM | POA: Diagnosis not present

## 2023-12-08 DIAGNOSIS — M542 Cervicalgia: Secondary | ICD-10-CM | POA: Diagnosis not present

## 2023-12-08 DIAGNOSIS — M79641 Pain in right hand: Secondary | ICD-10-CM | POA: Diagnosis not present

## 2023-12-08 DIAGNOSIS — M25561 Pain in right knee: Secondary | ICD-10-CM | POA: Diagnosis not present

## 2023-12-08 DIAGNOSIS — M79642 Pain in left hand: Secondary | ICD-10-CM | POA: Diagnosis not present

## 2023-12-08 DIAGNOSIS — M79672 Pain in left foot: Secondary | ICD-10-CM | POA: Diagnosis not present

## 2023-12-08 DIAGNOSIS — M791 Myalgia, unspecified site: Secondary | ICD-10-CM | POA: Diagnosis not present

## 2023-12-08 DIAGNOSIS — H04129 Dry eye syndrome of unspecified lacrimal gland: Secondary | ICD-10-CM | POA: Diagnosis not present

## 2023-12-08 DIAGNOSIS — R768 Other specified abnormal immunological findings in serum: Secondary | ICD-10-CM | POA: Diagnosis not present

## 2023-12-08 DIAGNOSIS — M25562 Pain in left knee: Secondary | ICD-10-CM | POA: Diagnosis not present

## 2023-12-09 ENCOUNTER — Ambulatory Visit (HOSPITAL_BASED_OUTPATIENT_CLINIC_OR_DEPARTMENT_OTHER): Payer: Self-pay | Admitting: Physical Therapy

## 2023-12-10 ENCOUNTER — Ambulatory Visit (HOSPITAL_BASED_OUTPATIENT_CLINIC_OR_DEPARTMENT_OTHER): Payer: Self-pay | Attending: Neurological Surgery | Admitting: Physical Therapy

## 2023-12-10 ENCOUNTER — Encounter (HOSPITAL_BASED_OUTPATIENT_CLINIC_OR_DEPARTMENT_OTHER): Payer: Self-pay | Admitting: Physical Therapy

## 2023-12-10 DIAGNOSIS — M4722 Other spondylosis with radiculopathy, cervical region: Secondary | ICD-10-CM | POA: Diagnosis not present

## 2023-12-10 DIAGNOSIS — R2689 Other abnormalities of gait and mobility: Secondary | ICD-10-CM | POA: Insufficient documentation

## 2023-12-10 DIAGNOSIS — M5459 Other low back pain: Secondary | ICD-10-CM | POA: Diagnosis not present

## 2023-12-10 DIAGNOSIS — M6281 Muscle weakness (generalized): Secondary | ICD-10-CM | POA: Insufficient documentation

## 2023-12-10 NOTE — Therapy (Signed)
 OUTPATIENT PHYSICAL THERAPY CERVICAL/THORACIC TREATMENT   Patient Name: Madison Waters MRN: [id number] DOB:[birth year], 43 y.o., female Today's Date: 12/10/2023  END OF SESSION:  PT End of Session - 12/10/23 0941     Visit Number 5    Date for PT Re-Evaluation 01/29/24    Authorization Type BC/BS    PT Start Time [phone]    PT Stop Time 1009    PT Time Calculation (min) 38 min    Behavior During Therapy WFL for tasks assessed/performed              Past Medical History:  Diagnosis Date   Anemia    Asthma    GERD (gastroesophageal reflux disease)    High cholesterol    Migraines    Past Surgical History:  Procedure Laterality Date   TONSILLECTOMY     WRIST FRACTURE SURGERY Left    There are no active problems to display for this patient.   PCP: Kennon Holter NP  REFERRING PROVIDER: Dawley, Alan Mulder, DO   REFERRING DIAG: [phone] (ICD-10-CM) - Cervical spondylosis with radiculopathy   THERAPY DIAG:  Other low back pain  Cervical spondylosis with radiculopathy  Muscle weakness (generalized)  Other abnormalities of gait and mobility  Rationale for Evaluation and Treatment: Rehabilitation  ONSET DATE: exacerbation x 4 months  SUBJECTIVE:                                                                                                                                                                                                         SUBJECTIVE STATEMENT: Pt reports her LEs feel heavy and pain level is high.  She had testing at rheumatologist this week; has not received full results yet.    Initial subjective Pt reports stumbling often.  When I am walking I have to pay attention to my steps. Have a lot of issues on my right side.  Have had injections in c6-c7 but c5 is where I have my issues.  Injections were last week.  I have high fatigue.  Sleep a lot.  My toleration to activity is low.  I have a positive ANA I wonder if I have MS.  Issues have been  progressive for many years recently have gotten worse. I cannot  feel my legs walking up steps.  Meds have helped me sleep Hand dominance: Right  PERTINENT HISTORY:  Balance and LBP with radiation into buttock and RLE Hx of PT in past without pain relief  PAIN: cervical and rue Are you having pain? Yes: NPRS scale: 9/10 Pain location:  right hip;  area between shoulder blades, R ant shoulder Pain description: electric pokes, stabbing, vice Aggravating factors: reaching, sitting Relieving factors: sitting, heating pads/ice; meds  PRECAUTIONS: Fall  RED FLAGS: None     WEIGHT BEARING RESTRICTIONS: No  FALLS:  Has patient fallen in last 6 months? No  LIVING ENVIRONMENT: Lives with: lives with their spouse Lives in: House/apartment Stairs: Yes: Internal: and external steps; on right going up and External: 4-5 steps; on right going up Has following equipment at home:  need ad for safety.  Presents today with in [facility]  OCCUPATION: nanny 4 hours a week  PLOF: Independent  PATIENT GOALS: increased mobility/walking  NEXT MD VISIT: Next week with neurologist  OBJECTIVE:  Note: Objective measures were completed at Evaluation unless otherwise noted.  DIAGNOSTIC FINDINGS:  IMPRESSION: Mild multilevel degenerative changes of the lumbar spine.  IMPRESSION: 1. Cervical spine degeneration as described. Mild degenerative anterolisthesis adjacent to the C3-4 level where there is incomplete segmentation. 2. C5-6 disc protrusion contacting but not compressing the cord. Diffusely patent foramina.  PATIENT SURVEYS:  ODI:28 x 2=56/45=80% NDI: 30/50  COGNITION: Overall cognitive status: Within functional limits for tasks assessed  SENSATION: WFL  POSTURE: rounded shoulders and increased thoracic kyphosis  PALPATION: Moderate TTP throughout cervical area and traps though paraspinals to glutes R>L   CERVICAL ROM:   full  UPPER EXTREMITY ROM:  Wfl     UPPER EXTREMITY  MMT:  Pain throughout with resistance MMT Right eval Left eval  Shoulder flexion 4- 4  Shoulder extension    Shoulder abduction 4- 4  Shoulder adduction 4- 4  Shoulder extension    Shoulder internal rotation    Shoulder external rotation    Middle trapezius    Lower trapezius    Elbow flexion    Elbow extension    Wrist flexion    Wrist extension    Wrist ulnar deviation    Wrist radial deviation    Wrist pronation    Wrist supination    Grip strength 11kg 2 kg   (Blank rows = not tested)        Hx of left wrist fx  LOWER EXTREMITY MMT:    MMT Right eval Left eval  Hip flexion 39.4 46.2  Hip extension    Hip abduction 17.6 26.2  Hip adduction    Hip internal rotation    Hip external rotation    Knee flexion    Knee extension 13.6 29.0  Ankle dorsiflexion    Ankle plantarflexion    Ankle inversion    Ankle eversion     (Blank rows = not tested)    Lumbar ROM : wfl flex/ext:  Side bending R/L limited by 80% due to pain  FUNCTIONAL TESTS:  5 times sit to stand: 36.32 from bench at [employer] assist Timed up and go (TUG): 21.63 4 stage balance test : 249ftft  Gait: 274ft pushing WC, 1 episode of tripping on right toes recovered indep.  Feet just clearing floor as distance increases.  Requires 10s standing prior to initiating gait  TREATMENT  Pt seen for aquatic therapy today.  Treatment took place in water 3.5-4.75 ft in depth at the [facility] pool. Temp of water was 91.  Pt entered/exited the pool via stairs using alternating pattern with hand rail. - walking forward / backward unsupported - Side Stepping without Hand Floats, thumb up  - [employer] with step back x 10 each  - Standing Shoulder Horizontal Abduction  without resistance x 10  - return to walking with reciprocal pattern - marching with reciprocal row with hand floats - farmer carry with single rainbow hand float, walking forward/ backward  - Standing 'L' Stretch at [employer]    - UE on wall: hip abdct/ addct x 10 each (some pain in R hip);  single leg clams x 10 each; hip circles x 10 each LE (good tolerance)  - Seated Straddle on Flotation Forward Breast Stroke Arms and Bicycle Legs   Pt requires the buoyancy and hydrostatic pressure of water for support, and to offload joints by unweighting joint load by at least 50 % in navel deep water and by at least 75-80% in chest to neck deep water.  Viscosity of the water is needed for resistance of strengthening. Water current perturbations provides challenge to standing balance requiring increased core activation.  PATIENT EDUCATION:  Education details: Intro to aquatic therapy  Person educated: Patient Education method: Explanation Education comprehension: verbalized understanding  HOME EXERCISE PROGRAM: Access Code: [email or web address] URL: [email or web address] This aquatic home exercise program from Med[employer] utilizes pictures from land based exercises, but has been adapted prior to lamination and issuance.   NOT ISSUED YET    ASSESSMENT:  CLINICAL IMPRESSION: Pt  reports some reduction in pain with aquatic intervention. Pt given instruction on initiation of final aquatic HEP and will be issued laminated program next visit. Will plan also on 1 land appointments for assignment of land based HEP with instructions on indep progression. Pt has partially met her goals.  Land therapist to assess goals prior to d/c.      Initial Impression Patient is a 43 y.o. f who was seen today for physical therapy evaluation and treatment for Cervical and LBP. 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  She reports radicular symptoms through all UE and LE with and without movement including numbness, tingling, and weakness that has been progressing rapidly over the past 4 months but have been present over the last few years . She does not report any changes in vision. She ha had no falls  but reports tripping over right foot often.  Objective testing indicates significant weakness throughout. Her functional scores indicate a high fall risk. She reports limitation with all household chores, functional mobility and ADL's.  She has high fatigability requiring naps in middle of day after running any kind of errands but not more than 1 errand at a time.  Pt pain/dysfunction is likely multifactorial. Questionable underlying CNS/autoimmune dysfunction brought up by pt.  Has an appt with a neurologist next week.  She will benefit form skilled PT intervention with plan both aquatic and land based.  Due to insurance co-pays pt may only been seen x once a week.  OBJECTIVE IMPAIRMENTS: Abnormal gait, decreased activity tolerance, decreased balance, decreased endurance, decreased knowledge of use of DME, difficulty walking, decreased strength, decreased safety awareness, impaired UE functional use, postural dysfunction, obesity, and pain.   ACTIVITY LIMITATIONS: carrying, lifting, bending, sitting, standing, squatting, sleeping, stairs, transfers, reach over head, and locomotion level  PARTICIPATION LIMITATIONS: meal prep, cleaning, laundry, driving, shopping, community activity, and occupation  PERSONAL FACTORS: Age, Time since onset of injury/illness/exacerbation, and 3+ comorbidities: see PmHx  are also affecting patient's functional outcome.   REHAB POTENTIAL: Good  CLINICAL DECISION MAKING: Unstable/unpredictable  EVALUATION COMPLEXITY: High   GOALS: Goals reviewed with patient? Yes  SHORT TERM GOALS: Target date: 3/10  Pt will tolerate full aquatic sessions consistently  without increase in pain and with improving function to demonstrate good toleration and effectiveness of intervention.  Baseline:  Goal status:Met 11/24/23  2.  Pt will tolerate walking to and from pool setting (875ft) and tolerate full aquatic sessions without increase in pain or significant fatigue.  Baseline:  unable Goal status: Met 11/24/23  3.  Pt will report a reduction in overall pain after aquatic sessions by 50% to demonstrate pain management using the properties of water. Baseline:  Goal status: INITIAL  4.  Pt will report compliance with HEP (walking and assignment of initial land based) Baseline:  Goal status: INITIAL    LONG TERM GOALS: Target date: 01/29/24  Pt will be indep with final HEP's (land and aquatic as appropriate) for continued management of condition Baseline:  Goal status: INITIAL  2.  Pt will report 50% improvement in toleration to driving Baseline:  Goal status: INITIAL  3.  Pt to improve on NDI by at least 5 points (benchmark) and ODI  by 25% to demonstrate statistically significant Improvement in function. Baseline: see chart Goal status: INITIAL  4.  Pt will improve on the by at least 40 ft (MDC) to demonstrate improved functional capacity,endurance and gait speed Baseline: 272ft Goal status: INITIAL  5.  Pt will improve strength in LE by at least 10 lbs and RUE to be equal to contralateral side to demonstrate improved overall physical function Baseline: see chart Goal status: INITIAL     PLAN:  PT FREQUENCY:1-2 x week  PT DURATION: 12 weeks extended due to scheduling conflicts/insurance co-pays  PLANNED INTERVENTIONS: 97164- PT Re-evaluation, 97110-Therapeutic exercises, 97530- Therapeutic activity, 97112- Neuromuscular re-education, 97535- Self Care, 16109- Manual therapy, [phone]- Gait training, [phone]- Orthotic Fit/training, [phone]- Aquatic Therapy, [phone]- Ionotophoresis 4mg /ml Dexamethasone, Patient/Family education, Balance training, Stair training, Taping, Dry Needling, Joint mobilization, DME instructions, Cryotherapy, and Moist heat  PLAN FOR NEXT SESSION: Land: Address cervical and ue dysfunction; gait with AD      Aquatics: general strengthening; balance and pain management  Mayer Camel, PTA 12/10/23 1:53 PM [facility] [address] [place], [place], [address] Phone: [phone]   Fax:  [phone]

## 2023-12-11 ENCOUNTER — Encounter (HOSPITAL_BASED_OUTPATIENT_CLINIC_OR_DEPARTMENT_OTHER): Payer: BC Managed Care – PPO | Admitting: Physical Therapy

## 2023-12-12 ENCOUNTER — Ambulatory Visit
Admission: RE | Admit: 2023-12-12 | Discharge: 2023-12-12 | Disposition: A | Discharge status: Home / Self Care | Payer: Self-pay | Source: Ambulatory Visit | Attending: Neurology

## 2023-12-12 DIAGNOSIS — R202 Paresthesia of skin: Secondary | ICD-10-CM

## 2023-12-12 DIAGNOSIS — R531 Weakness: Secondary | ICD-10-CM

## 2023-12-12 DIAGNOSIS — R413 Other amnesia: Secondary | ICD-10-CM | POA: Diagnosis not present

## 2023-12-12 DIAGNOSIS — R519 Headache, unspecified: Secondary | ICD-10-CM | POA: Diagnosis not present

## 2023-12-12 DIAGNOSIS — R2681 Unsteadiness on feet: Secondary | ICD-10-CM

## 2023-12-12 DIAGNOSIS — M791 Myalgia, unspecified site: Secondary | ICD-10-CM

## 2023-12-12 MED ORDER — GADOPICLENOL 0.5 MMOL/ML IV SOLN
10.0000 mL | Freq: Once | INTRAVENOUS | Status: AC | PRN
  Administered 2023-12-12: 10 mL via INTRAVENOUS

## 2023-12-12 NOTE — Addendum Note (Signed)
 Encounter addended by: Greg Cutter D, RT on: 12/12/2023 2:49 PM  Actions taken: Imaging Exam ended

## 2023-12-13 NOTE — Progress Notes (Signed)
 Patient advised.

## 2023-12-15 ENCOUNTER — Ambulatory Visit (HOSPITAL_BASED_OUTPATIENT_CLINIC_OR_DEPARTMENT_OTHER): Payer: BC Managed Care – PPO | Admitting: Physical Therapy

## 2023-12-16 ENCOUNTER — Ambulatory Visit (HOSPITAL_BASED_OUTPATIENT_CLINIC_OR_DEPARTMENT_OTHER): Payer: Self-pay

## 2023-12-16 ENCOUNTER — Encounter (HOSPITAL_BASED_OUTPATIENT_CLINIC_OR_DEPARTMENT_OTHER): Payer: Self-pay

## 2023-12-16 DIAGNOSIS — M5459 Other low back pain: Secondary | ICD-10-CM

## 2023-12-16 DIAGNOSIS — R2689 Other abnormalities of gait and mobility: Secondary | ICD-10-CM

## 2023-12-16 DIAGNOSIS — M4722 Other spondylosis with radiculopathy, cervical region: Secondary | ICD-10-CM | POA: Diagnosis not present

## 2023-12-16 DIAGNOSIS — M6281 Muscle weakness (generalized): Secondary | ICD-10-CM | POA: Diagnosis not present

## 2023-12-16 NOTE — Therapy (Signed)
 OUTPATIENT PHYSICAL THERAPY CERVICAL/THORACIC TREATMENT   Patient Name: Madison Waters MRN: [id number] DOB:09/20/1981, 43 y.o., female Today's Date: 12/16/2023  END OF SESSION:  PT End of Session - 12/16/23 0957     Visit Number 6    Date for PT Re-Evaluation 01/29/24    Authorization Type BC/BS    PT Start Time 0935    PT Stop Time 1019    PT Time Calculation (min) 44 min    Activity Tolerance Patient tolerated treatment well    Behavior During Therapy WFL for tasks assessed/performed               Past Medical History:  Diagnosis Date   Anemia    Asthma    GERD (gastroesophageal reflux disease)    High cholesterol    Migraines    Past Surgical History:  Procedure Laterality Date   TONSILLECTOMY     WRIST FRACTURE SURGERY Left    There are no active problems to display for this patient.   PCP: Kennon Holter NP  REFERRING PROVIDER: Dawley, Alan Mulder, DO   REFERRING DIAG: [phone] (ICD-10-CM) - Cervical spondylosis with radiculopathy   THERAPY DIAG:  Other low back pain  Muscle weakness (generalized)  Other abnormalities of gait and mobility  Cervical spondylosis with radiculopathy  Rationale for Evaluation and Treatment: Rehabilitation  ONSET DATE: exacerbation x 4 months  SUBJECTIVE:                                                                                                                                                                                                         SUBJECTIVE STATEMENT: Pt reports ongoing fatigue. New onset of worsening numbness in L hand and fingers since Saturday. R arm feels weaker than L with overhead movements.   Results from rheumatologist overall negative per pt though waiting on one more result to come in. Mid back is painful with some nerve related symptoms present.    Initial subjective Pt reports stumbling often.  When I am walking I have to pay attention to my steps. Have a lot of issues on my right side.   Have had injections in c6-c7 but c5 is where I have my issues.  Injections were last week.  I have high fatigue.  Sleep a lot.  My toleration to activity is low.  I have a positive ANA I wonder if I have MS.  Issues have been progressive for many years recently have gotten worse. I cannot  feel my legs walking up steps.  Meds have helped me sleep Hand  dominance: Right  PERTINENT HISTORY:  Balance and LBP with radiation into buttock and RLE Hx of PT in past without pain relief  PAIN: cervical and rue Are you having pain? Yes: NPRS scale: 9/10 Pain location: right hip;  area between shoulder blades, R ant shoulder Pain description: electric pokes, stabbing, vice Aggravating factors: reaching, sitting Relieving factors: sitting, heating pads/ice; meds  PRECAUTIONS: Fall  RED FLAGS: None     WEIGHT BEARING RESTRICTIONS: No  FALLS:  Has patient fallen in last 6 months? No  LIVING ENVIRONMENT: Lives with: lives with their spouse Lives in: House/apartment Stairs: Yes: Internal: and external steps; on right going up and External: 4-5 steps; on right going up Has following equipment at home:  need ad for safety.  Presents today with in [facility]  OCCUPATION: nanny 4 hours a week  PLOF: Independent  PATIENT GOALS: increased mobility/walking  NEXT MD VISIT: Next week with neurologist  OBJECTIVE:  Note: Objective measures were completed at Evaluation unless otherwise noted.  DIAGNOSTIC FINDINGS:  IMPRESSION: Mild multilevel degenerative changes of the lumbar spine.  IMPRESSION: 1. Cervical spine degeneration as described. Mild degenerative anterolisthesis adjacent to the C3-4 level where there is incomplete segmentation. 2. C5-6 disc protrusion contacting but not compressing the cord. Diffusely patent foramina.  PATIENT SURVEYS:  ODI:28 x 2=56/45=80% 3/20: 38x2=76%  NDI: 30/50 3/20:33/50  COGNITION: Overall cognitive status: Within functional limits for tasks  assessed  SENSATION: WFL  POSTURE: rounded shoulders and increased thoracic kyphosis  PALPATION: Moderate TTP throughout cervical area and traps though paraspinals to glutes R>L   CERVICAL ROM:   full  UPPER EXTREMITY ROM:  Wfl     UPPER EXTREMITY MMT:  Pain throughout with resistance MMT Right eval Left eval R 3/20 L 3/20  Shoulder flexion 4- 4 4+ 4  Shoulder extension      Shoulder abduction 4- 4 4+ 4+  Shoulder adduction 4- 4    Shoulder extension      Shoulder internal rotation      Shoulder external rotation      Middle trapezius      Lower trapezius      Elbow flexion      Elbow extension      Wrist flexion      Wrist extension      Wrist ulnar deviation      Wrist radial deviation      Wrist pronation      Wrist supination      Grip strength 11kg 2 kg     (Blank rows = not tested)        Hx of left wrist fx  LOWER EXTREMITY MMT:    MMT Right eval Left eval R 3/20 L 3/20  Hip flexion 39.4 46.2 45.4 49.6  Hip extension      Hip abduction 17.6 26.2 24.1 21.4  Hip adduction      Hip internal rotation      Hip external rotation      Knee flexion      Knee extension 13.6 29.0 31.1 35.9  Ankle dorsiflexion      Ankle plantarflexion      Ankle inversion      Ankle eversion       (Blank rows = not tested)    Lumbar ROM : wfl flex/ext:  Side bending R/L limited by 80% due to pain  FUNCTIONAL TESTS:  5 times sit to stand: 36.32 from bench at [employer] assist Timed up and go (  TUG): 21.63 4 stage balance test : 271ftft  3/20: 268ft (no AD)  5xSTS 45 seconds no UE support (from lowered plinth)  TUG: 19seconds (No AD)      Gait: 219ft pushing WC, 1 episode of tripping on right toes recovered indep.  Feet just clearing floor as distance increases.  Requires 10s standing prior to initiating gait  TREATMENT   3/20: Updated strength  Updated outcome measures -see objective section Reviewed goals Provided land based  HEP  Previous aquatic: Pt seen for aquatic therapy today.  Treatment took place in water 3.5-4.75 ft in depth at the [facility] pool. Temp of water was 91.  Pt entered/exited the pool via stairs using alternating pattern with hand rail. - walking forward / backward unsupported - Side Stepping without Hand Floats, thumb up  - [employer] with step back x 10 each  - Standing Shoulder Horizontal Abduction without resistance x 10  - return to walking with reciprocal pattern - marching with reciprocal row with hand floats - farmer carry with single rainbow hand float, walking forward/ backward  - Standing 'L' Stretch at [employer]   - UE on wall: hip abdct/ addct x 10 each (some pain in R hip);  single leg clams x 10 each; hip circles x 10 each LE (good tolerance)  - Seated Straddle on Flotation Forward Breast Stroke Arms and Bicycle Legs   Pt requires the buoyancy and hydrostatic pressure of water for support, and to offload joints by unweighting joint load by at least 50 % in navel deep water and by at least 75-80% in chest to neck deep water.  Viscosity of the water is needed for resistance of strengthening. Water current perturbations provides challenge to standing balance requiring increased core activation.  PATIENT EDUCATION:  Education details: Intro to aquatic therapy  Person educated: Patient Education method: Explanation Education comprehension: verbalized understanding  HOME EXERCISE PROGRAM: Access Code: [email or web address] URL: [email or web address] This aquatic home exercise program from Med[employer] utilizes pictures from land based exercises, but has been adapted prior to lamination and issuance.   NOT ISSUED YET  Land HEP: Access Code: [email or web address] URL: [email or web address] Date: 12/16/2023 Prepared by: Riki Altes  Exercises - Sit to Stand Without Arm Support  - 1 x daily - 7 x weekly - 3 sets - 5 reps - Standing March with Counter Support  -  1 x daily - 7 x weekly - 2 sets - 10 reps - Seated Long Arc Quad  - 1 x daily - 7 x weekly - 2 sets - 10 reps - 3 seconds hold - Standing 'L' Stretch at [employer]  - 1 x daily - 7 x weekly - 3-5 sets - 10seconds hold   ASSESSMENT:  CLINICAL IMPRESSION: Pt has attended 6 visits of PT thus far. Has met 2/4 STG and has partially met 5/5 LTG. Her strength has improved in all areas aside for L hip abduction, which decreased. ODI % decreased, though NDI increased slightly. She reports ongoing complaints of pain, radiculopathy, and weakness/fatigue. She has been working with other medical providers along with PT to determine if additional health prolems are present. Does report improvement in cervical mobility and tightness. Feels no improvement in R hip pain. Worsening of thoracic area pain which she feels is triggered by low back pain. Pt relies on cane and rolator for ambulation. Denies recent falls, but feels she is stumbling more. Provided pt with land based exercises to begin with. Will assess response at next  PT session. Will await additional medical testing results.    Initial Impression Patient is a 43 y.o. f who was seen today for physical therapy evaluation and treatment for Cervical and LBP. 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  She reports radicular symptoms through all UE and LE with and without movement including numbness, tingling, and weakness that has been progressing rapidly over the past 4 months but have been present over the last few years . She does not report any changes in vision. She ha had no falls but reports tripping over right foot often.  Objective testing indicates significant weakness throughout. Her functional scores indicate a high fall risk. She reports limitation with all household chores, functional mobility and ADL's.  She has high fatigability requiring naps in middle of day after running any kind of errands but not more than  1 errand at a time.  Pt pain/dysfunction is likely multifactorial. Questionable underlying CNS/autoimmune dysfunction brought up by pt.  Has an appt with a neurologist next week.  She will benefit form skilled PT intervention with plan both aquatic and land based.  Due to insurance co-pays pt may only been seen x once a week.  OBJECTIVE IMPAIRMENTS: Abnormal gait, decreased activity tolerance, decreased balance, decreased endurance, decreased knowledge of use of DME, difficulty walking, decreased strength, decreased safety awareness, impaired UE functional use, postural dysfunction, obesity, and pain.   ACTIVITY LIMITATIONS: carrying, lifting, bending, sitting, standing, squatting, sleeping, stairs, transfers, reach over head, and locomotion level  PARTICIPATION LIMITATIONS: meal prep, cleaning, laundry, driving, shopping, community activity, and occupation  PERSONAL FACTORS: Age, Time since onset of injury/illness/exacerbation, and 3+ comorbidities: see PmHx  are also affecting patient's functional outcome.   REHAB POTENTIAL: Good  CLINICAL DECISION MAKING: Unstable/unpredictable  EVALUATION COMPLEXITY: High   GOALS: Goals reviewed with patient? Yes  SHORT TERM GOALS: Target date: 3/10  Pt will tolerate full aquatic sessions consistently without increase in pain and with improving function to demonstrate good toleration and effectiveness of intervention.  Baseline:  Goal status:Met 11/24/23  2.  Pt will tolerate walking to and from pool setting (847ft) and tolerate full aquatic sessions without increase in pain or significant fatigue.  Baseline: unable Goal status: Met 11/24/23  3.  Pt will report a reduction in overall pain after aquatic sessions by 50% to demonstrate pain management using the properties of water. Baseline:  Goal status: Partially met 3/20. (Improved pain 75-80% for 1-2 days)  4.  Pt will report compliance with HEP (walking and assignment of initial land  based) Baseline:  Goal status: Partially met 3/20. Has been walking more. Provided with land based today.     LONG TERM GOALS: Target date: 01/29/24  Pt will be indep with final HEP's (land and aquatic as appropriate) for continued management of condition Baseline:  Goal status: IN PROGRESS 3/20  2.  Pt will report 50% improvement in toleration to driving Baseline:  Goal status: IN PROGRESS (feels improved cervical rotation, but poor tolerance due to back/hip pain)  3.  Pt to improve on NDI by at least 5 points (benchmark) and ODI  by 25% to demonstrate statistically significant Improvement in function. Baseline: see chart Goal status: IN PROGRESS- 3pt increase in NDI, 4% decrease in ODI  4.  Pt will improve on the by at least 40 ft (MDC) to demonstrate improved functional capacity,endurance and gait speed Baseline: 225ft Goal status: IN PROGRESS 283ft on 3/20  5.  Pt will  improve strength in LE by at least 10 lbs and RUE to be equal to contralateral side to demonstrate improved overall physical function Baseline: see chart Goal status: IN PROGRESS (met for shoulder abduction, R hip flexion, R hip abduction, and R/L knee extension)     PLAN:  PT FREQUENCY:1-2 x week  PT DURATION: 12 weeks extended due to scheduling conflicts/insurance co-pays  PLANNED INTERVENTIONS: 97164- PT Re-evaluation, 97110-Therapeutic exercises, 97530- Therapeutic activity, 97112- Neuromuscular re-education, 97535- Self Care, 78295- Manual therapy, [id number]- Gait training, [phone]- Orthotic Fit/training, [phone]- Aquatic Therapy, [phone]- Ionotophoresis 4mg /ml Dexamethasone, Patient/Family education, Balance training, Stair training, Taping, Dry Needling, Joint mobilization, DME instructions, Cryotherapy, and Moist heat  PLAN FOR NEXT SESSION: Land: Address cervical and ue dysfunction; gait with AD      Aquatics: general strengthening; balance and pain management  Mayer Camel, PTA 12/16/23 12:05  PM [facility] [address] [place], [place], [address] Phone: [phone]   Fax:  [phone]

## 2023-12-17 ENCOUNTER — Encounter: Payer: Self-pay | Admitting: Neurology

## 2023-12-17 ENCOUNTER — Encounter (HOSPITAL_BASED_OUTPATIENT_CLINIC_OR_DEPARTMENT_OTHER): Payer: BC Managed Care – PPO

## 2023-12-17 ENCOUNTER — Encounter (HOSPITAL_BASED_OUTPATIENT_CLINIC_OR_DEPARTMENT_OTHER): Payer: Self-pay

## 2023-12-20 DIAGNOSIS — M9903 Segmental and somatic dysfunction of lumbar region: Secondary | ICD-10-CM | POA: Diagnosis not present

## 2023-12-20 DIAGNOSIS — H9319 Tinnitus, unspecified ear: Secondary | ICD-10-CM | POA: Diagnosis not present

## 2023-12-20 DIAGNOSIS — M25511 Pain in right shoulder: Secondary | ICD-10-CM | POA: Diagnosis not present

## 2023-12-20 DIAGNOSIS — M9901 Segmental and somatic dysfunction of cervical region: Secondary | ICD-10-CM | POA: Diagnosis not present

## 2023-12-20 DIAGNOSIS — M9908 Segmental and somatic dysfunction of rib cage: Secondary | ICD-10-CM | POA: Diagnosis not present

## 2023-12-20 DIAGNOSIS — M9902 Segmental and somatic dysfunction of thoracic region: Secondary | ICD-10-CM | POA: Diagnosis not present

## 2023-12-20 DIAGNOSIS — M542 Cervicalgia: Secondary | ICD-10-CM | POA: Diagnosis not present

## 2023-12-22 ENCOUNTER — Ambulatory Visit (HOSPITAL_BASED_OUTPATIENT_CLINIC_OR_DEPARTMENT_OTHER): Payer: BC Managed Care – PPO | Admitting: Physical Therapy

## 2023-12-22 ENCOUNTER — Encounter (HOSPITAL_BASED_OUTPATIENT_CLINIC_OR_DEPARTMENT_OTHER): Payer: Self-pay | Admitting: Physical Therapy

## 2023-12-22 DIAGNOSIS — M4722 Other spondylosis with radiculopathy, cervical region: Secondary | ICD-10-CM | POA: Diagnosis not present

## 2023-12-22 DIAGNOSIS — M6281 Muscle weakness (generalized): Secondary | ICD-10-CM

## 2023-12-22 DIAGNOSIS — R2689 Other abnormalities of gait and mobility: Secondary | ICD-10-CM

## 2023-12-22 DIAGNOSIS — M5459 Other low back pain: Secondary | ICD-10-CM

## 2023-12-22 NOTE — Therapy (Signed)
 OUTPATIENT PHYSICAL THERAPY CERVICAL/THORACIC TREATMENT PHYSICAL THERAPY DISCHARGE SUMMARY  Visits from Start of Care: 7  Current functional level related to goals / functional outcomes: Improved indep but continues to require Ad at times   Remaining deficits: Chronic pain/condition   Education / Equipment: Management of condition/HEP   Patient agrees to discharge. Patient goals were partially met. Patient is being discharged due to being pleased with the current functional level./also financial strain.   Patient Name: Madison Waters MRN: [id number] DOB:1981/01/28, 43 y.o., female Today's Date: 12/22/2023  END OF SESSION:  PT End of Session - 12/22/23 0813     Visit Number 7    Date for PT Re-Evaluation 01/29/24    Authorization Type BC/BS    PT Start Time 0805    PT Stop Time 0845    PT Time Calculation (min) 40 min    Activity Tolerance Patient tolerated treatment well    Behavior During Therapy WFL for tasks assessed/performed               Past Medical History:  Diagnosis Date   Anemia    Asthma    GERD (gastroesophageal reflux disease)    High cholesterol    Migraines    Past Surgical History:  Procedure Laterality Date   TONSILLECTOMY     WRIST FRACTURE SURGERY Left    There are no active problems to display for this patient.   PCP: Kennon Holter NP  REFERRING PROVIDER: Dawley, Alan Mulder, DO   REFERRING DIAG: [phone] (ICD-10-CM) - Cervical spondylosis with radiculopathy   THERAPY DIAG:  Other low back pain  Muscle weakness (generalized)  Other abnormalities of gait and mobility  Cervical spondylosis with radiculopathy  Rationale for Evaluation and Treatment: Rehabilitation  ONSET DATE: exacerbation x 4 months  SUBJECTIVE:                                                                                                                                                                                                         SUBJECTIVE  STATEMENT: Pt reports ongoing fatigue. New onset of worsening numbness in L hand and fingers since Saturday. R arm feels weaker than L with overhead movements.   Results from rheumatologist overall negative per pt though waiting on one more result to come in. Mid back is painful with some nerve related symptoms present.    Initial subjective Pt reports stumbling often.  When I am walking I have to pay attention to my steps. Have a lot of issues on my right side.  Have had injections in  c6-c7 but c5 is where I have my issues.  Injections were last week.  I have high fatigue.  Sleep a lot.  My toleration to activity is low.  I have a positive ANA I wonder if I have MS.  Issues have been progressive for many years recently have gotten worse. I cannot  feel my legs walking up steps.  Meds have helped me sleep Hand dominance: Right  PERTINENT HISTORY:  Balance and LBP with radiation into buttock and RLE Hx of PT in past without pain relief  PAIN: cervical and rue Are you having pain? Yes: NPRS scale: 9/10 Pain location: right hip;  area between shoulder blades, R ant shoulder Pain description: electric pokes, stabbing, vice Aggravating factors: reaching, sitting Relieving factors: sitting, heating pads/ice; meds  PRECAUTIONS: Fall  RED FLAGS: None     WEIGHT BEARING RESTRICTIONS: No  FALLS:  Has patient fallen in last 6 months? No  LIVING ENVIRONMENT: Lives with: lives with their spouse Lives in: House/apartment Stairs: Yes: Internal: and external steps; on right going up and External: 4-5 steps; on right going up Has following equipment at home:  need ad for safety.  Presents today with in [facility]  OCCUPATION: nanny 4 hours a week  PLOF: Independent  PATIENT GOALS: increased mobility/walking  NEXT MD VISIT: Next week with neurologist  OBJECTIVE:  Note: Objective measures were completed at Evaluation unless otherwise noted.  DIAGNOSTIC FINDINGS:  IMPRESSION: Mild multilevel  degenerative changes of the lumbar spine.  IMPRESSION: 1. Cervical spine degeneration as described. Mild degenerative anterolisthesis adjacent to the C3-4 level where there is incomplete segmentation. 2. C5-6 disc protrusion contacting but not compressing the cord. Diffusely patent foramina.  PATIENT SURVEYS:  ODI:28 x 2=56/45=80% 3/20: 38x2=76%  NDI: 30/50 3/20:33/50  COGNITION: Overall cognitive status: Within functional limits for tasks assessed  SENSATION: WFL  POSTURE: rounded shoulders and increased thoracic kyphosis  PALPATION: Moderate TTP throughout cervical area and traps though paraspinals to glutes R>L   CERVICAL ROM:   full  UPPER EXTREMITY ROM:  Wfl     UPPER EXTREMITY MMT:  Pain throughout with resistance MMT Right eval Left eval R 3/20 L 3/20  Shoulder flexion 4- 4 4+ 4  Shoulder extension      Shoulder abduction 4- 4 4+ 4+  Shoulder adduction 4- 4    Shoulder extension      Shoulder internal rotation      Shoulder external rotation      Middle trapezius      Lower trapezius      Elbow flexion      Elbow extension      Wrist flexion      Wrist extension      Wrist ulnar deviation      Wrist radial deviation      Wrist pronation      Wrist supination      Grip strength 11kg 2 kg     (Blank rows = not tested)        Hx of left wrist fx  LOWER EXTREMITY MMT:    MMT Right eval Left eval R 3/20 L 3/20  Hip flexion 39.4 46.2 45.4 49.6  Hip extension      Hip abduction 17.6 26.2 24.1 21.4  Hip adduction      Hip internal rotation      Hip external rotation      Knee flexion      Knee extension 13.6 29.0 31.1 35.9  Ankle dorsiflexion  Ankle plantarflexion      Ankle inversion      Ankle eversion       (Blank rows = not tested)    Lumbar ROM : wfl flex/ext:  Side bending R/L limited by 80% due to pain  FUNCTIONAL TESTS:  5 times sit to stand: 36.32 from bench at pool rue assist Timed up and go (TUG): 21.63 4 stage  balance test : 254ftft  3/20: 255ft (no AD)  5xSTS 45 seconds no UE support (from lowered plinth)  TUG: 19seconds (No AD)      Gait: 263ft pushing WC, 1 episode of tripping on right toes recovered indep.  Feet just clearing floor as distance increases.  Requires 10s standing prior to initiating gait  TREATMENT  Pt seen for aquatic therapy today.  Treatment took place in water 3.5-4.75 ft in depth at the [facility] pool. Temp of water was 91.  Pt entered/exited the pool via stairs using alternating pattern with hand rail. - walking forward / backward unsupported - Side Stepping without Hand Floats, thumb up  - [employer] with step back x 10 each  - Standing Shoulder Horizontal Abduction without resistance x 10  - return to walking with reciprocal pattern - marching with reciprocal row with hand floats - farmer carry with single rainbow hand float, walking forward/ backward  - Standing 'L' Stretch at [employer]   - UE on wall: hip abdct/ addct x 10 each (some pain in R hip);  single leg clams x 10 each; hip circles x 10 each LE (good tolerance)  - Seated Straddle on Flotation Forward Breast Stroke Arms and Bicycle Legs   Pt requires the buoyancy and hydrostatic pressure of water for support, and to offload joints by unweighting joint load by at least 50 % in navel deep water and by at least 75-80% in chest to neck deep water.  Viscosity of the water is needed for resistance of strengthening. Water current perturbations provides challenge to standing balance requiring increased core activation.   3/20: Updated strength  Updated outcome measures -see objective section Reviewed goals Provided land based HEP    PATIENT EDUCATION:  Education details: Intro to aquatic therapy  Person educated: Patient Education method: Explanation Education comprehension: verbalized understanding  HOME EXERCISE PROGRAM: Access Code: [email or web address] URL:  [email or web address] This aquatic home exercise program from MedBridge utilizes pictures from land based exercises, but has been adapted prior to lamination and issuance.   NOT ISSUED YET  Land HEP: Access Code: [email or web address] URL: [email or web address] Date: 12/16/2023 Prepared by: Riki Altes  Exercises - Sit to Stand Without Arm Support  - 1 x daily - 7 x weekly - 3 sets - 5 reps - Standing March with Counter Support  - 1 x daily - 7 x weekly - 2 sets - 10 reps - Seated Long Arc Quad  - 1 x daily - 7 x weekly - 2 sets - 10 reps - 3 seconds hold - Standing 'L' Stretch at [employer]  - 1 x daily - 7 x weekly - 3-5 sets - 10seconds hold   ASSESSMENT:  CLINICAL IMPRESSION: Pt reports good response with compliance with HEP land based.  She is instructed in final aquatic HEP today.  VC, demonstration as well as written clarifications given.  She completes well without any adverse events or pain.  Pain does reduce by end of session by 2-3 NPRS.  She is happy with her programs and ready to complete indep.  Not  all goals completely met but she did make good progress towards all.  Pt has attended 6 visits of PT thus far. Has met 2/4 STG and has partially met 5/5 LTG. Her strength has improved in all areas aside for L hip abduction, which decreased. ODI % decreased, though NDI increased slightly. She reports ongoing complaints of pain, radiculopathy, and weakness/fatigue. She has been working with other medical providers along with PT to determine if additional health prolems are present. Does report improvement in cervical mobility and tightness. Feels no improvement in R hip pain. Worsening of thoracic area pain which she feels is triggered by low back pain. Pt relies on cane and rolator for ambulation. Denies recent falls, but feels she is stumbling more. Provided pt with land based exercises to begin with. Will assess response at next PT session. Will await additional medical  testing results.     OBJECTIVE IMPAIRMENTS: Abnormal gait, decreased activity tolerance, decreased balance, decreased endurance, decreased knowledge of use of DME, difficulty walking, decreased strength, decreased safety awareness, impaired UE functional use, postural dysfunction, obesity, and pain.   ACTIVITY LIMITATIONS: carrying, lifting, bending, sitting, standing, squatting, sleeping, stairs, transfers, reach over head, and locomotion level  PARTICIPATION LIMITATIONS: meal prep, cleaning, laundry, driving, shopping, community activity, and occupation  PERSONAL FACTORS: Age, Time since onset of injury/illness/exacerbation, and 3+ comorbidities: see PmHx  are also affecting patient's functional outcome.   REHAB POTENTIAL: Good  CLINICAL DECISION MAKING: Unstable/unpredictable  EVALUATION COMPLEXITY: High   GOALS: Goals reviewed with patient? Yes  SHORT TERM GOALS: Target date: 3/10  Pt will tolerate full aquatic sessions consistently without increase in pain and with improving function to demonstrate good toleration and effectiveness of intervention.  Baseline:  Goal status:Met 11/24/23  2.  Pt will tolerate walking to and from pool setting (816ft) and tolerate full aquatic sessions without increase in pain or significant fatigue.  Baseline: unable Goal status: Met 11/24/23  3.  Pt will report a reduction in overall pain after aquatic sessions by 50% to demonstrate pain management using the properties of water. Baseline:  Goal status: Partially met 3/20. (Improved pain 75-80% for 1-2 days)  4.  Pt will report compliance with HEP (walking and assignment of initial land based) Baseline:  Goal status: Partially met 3/20. Has been walking more. Provided with land based today.     LONG TERM GOALS: Target date: 01/29/24  Pt will be indep with final HEP's (land and aquatic as appropriate) for continued management of condition Baseline:  Goal status: IN PROGRESS 3/20; MET  12/22/23  2.  Pt will report 50% improvement in toleration to driving Baseline:  Goal status: IN PROGRESS (feels improved cervical rotation, but poor tolerance due to back/hip pain); Improved but not met. Only tolerates ~ 15 min. 12/22/23  3.  Pt to improve on NDI by at least 5 points (benchmark) and ODI  by 25% to demonstrate statistically significant Improvement in function. Baseline: see chart Goal status: IN PROGRESS- 3pt increase in NDI, 4% decrease in ODI 3/20  4.  Pt will improve on the by at least 40 ft (MDC) to demonstrate improved functional capacity,endurance and gait speed Baseline: 231ft Goal status: IN PROGRESS 230ft on 3/20; Not met 12/22/23  5.  Pt will improve strength in LE by at least 10 lbs and RUE to be equal to contralateral side to demonstrate improved overall physical function Baseline: see chart Goal status: IN PROGRESS (met for shoulder abduction, R hip flexion, R hip abduction,  and R/L knee extension); improved but not met (LE by 10lbs). 12/22/23     PLAN:  PT FREQUENCY:1-2 x week  PT DURATION: 12 weeks extended due to scheduling conflicts/insurance co-pays  PLANNED INTERVENTIONS: 97164- PT Re-evaluation, 97110-Therapeutic exercises, 97530- Therapeutic activity, 97112- Neuromuscular re-education, 97535- Self Care, 69629- Manual therapy, [phone]- Gait training, [phone]- Orthotic Fit/training, [phone]- Aquatic Therapy, [phone]- Ionotophoresis 4mg /ml Dexamethasone, Patient/Family education, Balance training, Stair training, Taping, Dry Needling, Joint mobilization, DME instructions, Cryotherapy, and Moist heat  PLAN FOR NEXT SESSION: Land: Address cervical and ue dysfunction; gait with AD      Aquatics: general strengthening; balance and pain management  Rushie Chestnut) Yostin Malacara MPT 12/22/23 8:49 AM [facility] [address] [place], [place], [address] Phone: [phone]   Fax:  [phone]

## 2023-12-23 ENCOUNTER — Other Ambulatory Visit (HOSPITAL_COMMUNITY): Payer: Self-pay

## 2023-12-23 ENCOUNTER — Telehealth: Payer: Self-pay

## 2023-12-23 DIAGNOSIS — M2550 Pain in unspecified joint: Secondary | ICD-10-CM | POA: Diagnosis not present

## 2023-12-23 DIAGNOSIS — R768 Other specified abnormal immunological findings in serum: Secondary | ICD-10-CM | POA: Diagnosis not present

## 2023-12-23 DIAGNOSIS — R5383 Other fatigue: Secondary | ICD-10-CM | POA: Diagnosis not present

## 2023-12-23 DIAGNOSIS — M549 Dorsalgia, unspecified: Secondary | ICD-10-CM | POA: Diagnosis not present

## 2023-12-23 NOTE — Telephone Encounter (Signed)
 PA request has been Submitted. New Encounter has been or will be created for follow up. For additional info see Pharmacy Prior Auth telephone encounter from 03/27.

## 2023-12-23 NOTE — Telephone Encounter (Signed)
*  [facility]  Pharmacy Patient Advocate Encounter   Received notification from CoverMyMeds that prior authorization for Aimovig 140MG /ML auto-injectors  is required/requested.   Insurance verification completed.   The patient is insured through [facility] .   Per test claim: PA required; PA submitted to above mentioned insurance via CoverMyMeds Key/confirmation #/EOC [id number] Status is pending

## 2023-12-23 NOTE — Telephone Encounter (Signed)
 Per patient a PA is needed for Aimovig.   Patient is past due on her injection.

## 2023-12-24 NOTE — Telephone Encounter (Signed)
 Approved. Effective Date: 12/23/2023 Authorization Expiration Date: 12/22/2024

## 2023-12-27 ENCOUNTER — Telehealth: Admitting: Emergency Medicine

## 2023-12-27 DIAGNOSIS — B9689 Other specified bacterial agents as the cause of diseases classified elsewhere: Secondary | ICD-10-CM

## 2023-12-27 DIAGNOSIS — J019 Acute sinusitis, unspecified: Secondary | ICD-10-CM

## 2023-12-27 MED ORDER — AMOXICILLIN-POT CLAVULANATE 875-125 MG PO TABS: 1.0000 | ORAL_TABLET | Freq: Two times a day (BID) | ORAL | 0 refills | Status: DC

## 2023-12-27 NOTE — Progress Notes (Signed)
 E-Visit for Sinus Problems  We are sorry that you are not feeling well.  Here is how we plan to help!  Based on what you have shared with me it looks like you have sinusitis.  Sinusitis is inflammation and infection in the sinus cavities of the head.  Based on your presentation I believe you most likely have Acute Bacterial Sinusitis.  This is an infection caused by bacteria and is treated with antibiotics. I have prescribed Augmentin 875mg /125mg  one tablet twice daily with food, for 7 days.   You may use an oral decongestant such as Mucinex D or if you have glaucoma or high blood pressure use plain Mucinex.   Saline nasal spray help and can safely be used as often as needed for congestion.  Try using saline irrigation, such as with a neti pot, several times a day while you are sick. Many neti pots come with salt packets premeasured to use to make saline. If you use your own salt, make sure it is kosher salt or sea salt (don't use table salt as it has iodine in it and you don't need that in your nose). Use distilled water to make saline. If you mix your own saline using your own salt, the recipe is 1/4 teaspoon salt in 1 cup warm water. Using saline irrigation can help prevent and treat sinus infections.   If you develop worsening sinus pain, fever or notice severe headache and vision changes, or if symptoms are not better after completion of antibiotic, please schedule an appointment with a health care provider.    Sinus infections are not as easily transmitted as other respiratory infection, however we still recommend that you avoid close contact with loved ones, especially the very young and elderly.  Remember to wash your hands thoroughly throughout the day as this is the number one way to prevent the spread of infection!  Home Care: Only take medications as instructed by your medical team. Complete the entire course of an antibiotic. Do not take these medications with alcohol. A steam or  ultrasonic humidifier can help congestion.  You can place a towel over your head and breathe in the steam from hot water coming from a faucet. Avoid close contacts especially the very young and the elderly. Cover your mouth when you cough or sneeze. Always remember to wash your hands.  Get Help Right Away If: You develop worsening fever or sinus pain. You develop a severe head ache or visual changes. Your symptoms persist after you have completed your treatment plan.  Make sure you Understand these instructions. Will watch your condition. Will get help right away if you are not doing well or get worse.  Thank you for choosing an e-visit.  Your e-visit answers were reviewed by a board certified advanced clinical practitioner to complete your personal care plan. Depending upon the condition, your plan could have included both over the counter or prescription medications.  Please review your pharmacy choice. Make sure the pharmacy is open so you can pick up prescription now. If there is a problem, you may contact your provider through [employer] and have the prescription routed to another pharmacy.  Your safety is important to [place]. If you have drug allergies check your prescription carefully.   For the next 24 hours you can use MyChart to ask questions about today's visit, request a non-urgent call back, or ask for a work or school excuse. You will get an email in the next two days asking  about your experience. I hope that your e-visit has been valuable and will speed your recovery.  I have spent 5 minutes in review of e-visit questionnaire, review and updating patient chart, medical decision making and response to patient.   Rica Mast, PhD, FNP-BC

## 2024-01-02 ENCOUNTER — Emergency Department (HOSPITAL_COMMUNITY)

## 2024-01-02 ENCOUNTER — Emergency Department (HOSPITAL_COMMUNITY): Admission: EM | Admit: 2024-01-02 | Discharge: 2024-01-02 | Disposition: A | Discharge status: Home / Self Care

## 2024-01-02 ENCOUNTER — Encounter (HOSPITAL_COMMUNITY): Payer: Self-pay

## 2024-01-02 ENCOUNTER — Other Ambulatory Visit: Payer: Self-pay

## 2024-01-02 DIAGNOSIS — R079 Chest pain, unspecified: Secondary | ICD-10-CM | POA: Diagnosis not present

## 2024-01-02 DIAGNOSIS — Z7951 Long term (current) use of inhaled steroids: Secondary | ICD-10-CM | POA: Insufficient documentation

## 2024-01-02 DIAGNOSIS — R0781 Pleurodynia: Secondary | ICD-10-CM | POA: Diagnosis not present

## 2024-01-02 DIAGNOSIS — M5134 Other intervertebral disc degeneration, thoracic region: Secondary | ICD-10-CM | POA: Diagnosis not present

## 2024-01-02 DIAGNOSIS — M4804 Spinal stenosis, thoracic region: Secondary | ICD-10-CM | POA: Diagnosis not present

## 2024-01-02 DIAGNOSIS — M549 Dorsalgia, unspecified: Secondary | ICD-10-CM | POA: Diagnosis not present

## 2024-01-02 DIAGNOSIS — M5124 Other intervertebral disc displacement, thoracic region: Secondary | ICD-10-CM | POA: Insufficient documentation

## 2024-01-02 DIAGNOSIS — M48061 Spinal stenosis, lumbar region without neurogenic claudication: Secondary | ICD-10-CM | POA: Diagnosis not present

## 2024-01-02 DIAGNOSIS — N83202 Unspecified ovarian cyst, left side: Secondary | ICD-10-CM | POA: Diagnosis not present

## 2024-01-02 DIAGNOSIS — R109 Unspecified abdominal pain: Secondary | ICD-10-CM | POA: Diagnosis not present

## 2024-01-02 DIAGNOSIS — R140 Abdominal distension (gaseous): Secondary | ICD-10-CM | POA: Diagnosis not present

## 2024-01-02 DIAGNOSIS — M4186 Other forms of scoliosis, lumbar region: Secondary | ICD-10-CM | POA: Diagnosis not present

## 2024-01-02 DIAGNOSIS — J45909 Unspecified asthma, uncomplicated: Secondary | ICD-10-CM | POA: Diagnosis not present

## 2024-01-02 DIAGNOSIS — R0789 Other chest pain: Secondary | ICD-10-CM | POA: Diagnosis not present

## 2024-01-02 DIAGNOSIS — K429 Umbilical hernia without obstruction or gangrene: Secondary | ICD-10-CM | POA: Diagnosis not present

## 2024-01-02 LAB — COMPREHENSIVE METABOLIC PANEL WITH GFR
ALT: 13 U/L (ref 0–44)
AST: 18 U/L (ref 15–41)
Albumin: 3.6 g/dL (ref 3.5–5.0)
Alkaline Phosphatase: 60 U/L (ref 38–126)
Anion gap: 12 (ref 5–15)
BUN: 11 mg/dL (ref 6–20)
CO2: 22 mmol/L (ref 22–32)
Calcium: 9.3 mg/dL (ref 8.9–10.3)
Chloride: 105 mmol/L (ref 98–111)
Creatinine, Ser: 1.25 mg/dL — ABNORMAL HIGH (ref 0.44–1.00)
GFR, Estimated: 54 mL/min — ABNORMAL LOW (ref >60)
Glucose, Bld: 105 mg/dL — ABNORMAL HIGH (ref 70–99)
Potassium: 3.8 mmol/L (ref 3.5–5.1)
Sodium: 139 mmol/L (ref 135–145)
Total Bilirubin: 0.5 mg/dL (ref 0.0–1.2)
Total Protein: 6.3 g/dL — ABNORMAL LOW (ref 6.5–8.1)

## 2024-01-02 LAB — CBC WITH DIFFERENTIAL/PLATELET
Abs Immature Granulocytes: 0.06 10*3/uL (ref 0.00–0.07)
Basophils Absolute: 0.1 10*3/uL (ref 0.0–0.1)
Basophils Relative: 1 %
Eosinophils Absolute: 0.2 10*3/uL (ref 0.0–0.5)
Eosinophils Relative: 2 %
HCT: 39.8 % (ref 36.0–46.0)
Hemoglobin: 12.9 g/dL (ref 12.0–15.0)
Immature Granulocytes: 1 %
Lymphocytes Relative: 23 %
Lymphs Abs: 2.2 10*3/uL (ref 0.7–4.0)
MCH: 29.8 pg (ref 26.0–34.0)
MCHC: 32.4 g/dL (ref 30.0–36.0)
MCV: 91.9 fL (ref 80.0–100.0)
Monocytes Absolute: 0.4 10*3/uL (ref 0.1–1.0)
Monocytes Relative: 4 %
Neutro Abs: 6.6 10*3/uL (ref 1.7–7.7)
Neutrophils Relative %: 69 %
Platelets: 318 10*3/uL (ref 150–400)
RBC: 4.33 MIL/uL (ref 3.87–5.11)
RDW: 13.4 % (ref 11.5–15.5)
WBC: 9.4 10*3/uL (ref 4.0–10.5)
nRBC: 0 % (ref 0.0–0.2)

## 2024-01-02 LAB — RESP PANEL BY RT-PCR (RSV, FLU A&B, COVID)  RVPGX2
Influenza A by PCR: NEGATIVE
Influenza B by PCR: NEGATIVE
Resp Syncytial Virus by PCR: NEGATIVE
SARS Coronavirus 2 by RT PCR: NEGATIVE

## 2024-01-02 LAB — HCG, SERUM, QUALITATIVE: Preg, Serum: NEGATIVE

## 2024-01-02 LAB — URINALYSIS, ROUTINE W REFLEX MICROSCOPIC
Bilirubin Urine: NEGATIVE
Glucose, UA: NEGATIVE mg/dL
Hgb urine dipstick: NEGATIVE
Ketones, ur: NEGATIVE mg/dL
Leukocytes,Ua: NEGATIVE
Nitrite: NEGATIVE
Protein, ur: NEGATIVE mg/dL
Specific Gravity, Urine: 1.004 — ABNORMAL LOW (ref 1.005–1.030)
pH: 7 (ref 5.0–8.0)

## 2024-01-02 LAB — LACTIC ACID, PLASMA
Lactic Acid, Venous: 2.2 mmol/L (ref 0.5–1.9)
Lactic Acid, Venous: 0.9 mmol/L (ref 0.5–1.9)

## 2024-01-02 LAB — LIPASE, BLOOD: Lipase: 30 U/L (ref 11–51)

## 2024-01-02 LAB — D-DIMER, QUANTITATIVE: D-Dimer, Quant: <0.27 ug{FEU}/mL (ref 0.00–0.50)

## 2024-01-02 MED ORDER — LACTATED RINGERS IV BOLUS
1000.0000 mL | Freq: Once | INTRAVENOUS | Status: AC
  Administered 2024-01-02: 1000 mL via INTRAVENOUS

## 2024-01-02 MED ORDER — METHOCARBAMOL 750 MG PO TABS: 750.0000 mg | ORAL_TABLET | Freq: Four times a day (QID) | ORAL | 0 refills | Status: DC | PRN

## 2024-01-02 NOTE — ED Notes (Signed)
 Patient transported to MRI

## 2024-01-02 NOTE — Discharge Instructions (Signed)
 Please take the higher dose of the methocarbamol.  Please pick up some Voltaren gel from the pharmacy and use this.  Continue your Lyrica.  Follow-up with the neurosurgeon for reevaluation.  Return to the ER for worsening symptoms.

## 2024-01-02 NOTE — ED Provider Notes (Signed)
 [place] EMERGENCY DEPARTMENT AT [facility] Provider Note   CSN: [id number] Arrival date & time: 01/02/24  0700     History  Chief Complaint  Patient presents with   Chest Pain    Madison Waters is a 43 y.o. female.  43 year old female with past medical history of chronic migraine headaches and asthma presenting to the emergency department today with left-sided flank pain.  The patient states that this been going on now for the past few days.  She states that she has been having pain mostly on the left side during this time.  She denies any fevers, chills, or cough.  States that she does have some mild shortness of breath but she does have asthma and is unsure if this is related or not.  She denies any new leg pain or swelling.  The patient tells me that she has been having issues with chronic back pain and has seen multiple specialists including rheumatology as well as neurosurgery and is currently undergoing physical therapy.  She states that she thinks this may be related but cannot say so for sure.  She is currently seeing rheumatology and is in the midst of a workup for rheumatologic causes for her symptoms.  She denies a history of DVT or pulmonary embolism.   Chest Pain      Home Medications Prior to Admission medications   Medication Sig Start Date End Date Taking? Authorizing Provider  methocarbamol (ROBAXIN) 750 MG tablet Take 1 tablet (750 mg total) by mouth every 6 (six) hours as needed for muscle spasms. 01/02/24  Yes Durwin Glaze, MD  ADVAIR DISKUS 250-50 MCG/ACT AEPB Inhale 1 puff into the lungs 2 (two) times daily. 06/03/22   [provider]  albuterol (VENTOLIN HFA) 108 (90 Base) MCG/ACT inhaler Inhale 2 puffs into the lungs every 6 (six) hours as needed. 05/30/22   [provider]  amoxicillin-clavulanate (AUGMENTIN) 875-125 MG tablet Take 1 tablet by mouth 2 (two) times daily. 12/27/23   Cathlyn Parsons, NP  DULoxetine (CYMBALTA) 60 MG capsule  Take 60 mg by mouth daily. 05/11/22   [provider]  Erenumab-aooe (AIMOVIG) 140 MG/ML SOAJ Inject 140 mg into the skin every 28 (twenty-eight) days. 04/28/23   Everlena Cooper, Adam R, DO  pantoprazole (PROTONIX) 40 MG tablet Take 40 mg by mouth 2 (two) times daily. 05/18/22   [provider]  promethazine (PHENERGAN) 25 MG tablet Take 1 tablet (25 mg total) by mouth every 6 (six) hours as needed for nausea or vomiting. 11/16/23   Everlena Cooper, Adam R, DO  sucralfate (CARAFATE) 1 g tablet Take 1 g by mouth 2 (two) times daily. 06/26/22   [provider]  SUMAtriptan (IMITREX) 100 MG tablet Take 1 tablet (100 mg total) by mouth as needed for migraine. May repeat in 2 hours if headache persists or recurs.  Maximum 2 tablets in 24 hours. 04/28/23   Drema Dallas, DO  traZODone (DESYREL) 100 MG tablet Take 100 mg by mouth at bedtime. 05/28/22   [provider]      Allergies    Sulfa antibiotics    Review of Systems   Review of Systems  Cardiovascular:  Positive for chest pain.  Genitourinary:  Positive for flank pain.  All other systems reviewed and are negative.   Physical Exam Updated Vital Signs BP 107/77   Pulse 87   Temp 99 F (37.2 C) (Oral)   Resp (!) 26   Ht 5'  4" (1.626 m)   Wt 118.4 kg   SpO2 100%   BMI 44.80 kg/m  Physical Exam Vitals and nursing note reviewed.   Gen: NAD Eyes: PERRL, EOMI HEENT: no oropharyngeal swelling Neck: trachea midline Resp: clear to auscultation bilaterally Card: Tachycardic, no murmurs, rubs, or gallops Abd: nontender, nondistended, left-sided CVA tenderness noted Extremities: no calf tenderness, no edema MSK: No midline tenderness, the patient is tender over the left thoracic paraspinal region Vascular: 2+ radial pulses bilaterally, 2+ DP pulses bilaterally Neuro: No focal deficits Skin: no rashes Psyc: acting appropriately   ED Results / Procedures / Treatments   Labs (all labs ordered are listed, but only abnormal  results are displayed) Labs Reviewed  COMPREHENSIVE METABOLIC PANEL WITH GFR - Abnormal; Notable for the following components:      Result Value   Glucose, Bld 105 (*)    Creatinine, Ser 1.25 (*)    Total Protein 6.3 (*)    GFR, Estimated 54 (*)    All other components within normal limits  URINALYSIS, ROUTINE W REFLEX MICROSCOPIC - Abnormal; Notable for the following components:   Color, Urine STRAW (*)    Specific Gravity, Urine 1.004 (*)    All other components within normal limits  LACTIC ACID, PLASMA - Abnormal; Notable for the following components:   Lactic Acid, Venous 2.2 (*)    All other components within normal limits  RESP PANEL BY RT-PCR (RSV, FLU A&B, COVID)  [id number]  CBC WITH DIFFERENTIAL/PLATELET  LIPASE, BLOOD  D-DIMER, QUANTITATIVE  LACTIC ACID, PLASMA  HCG, SERUM, QUALITATIVE    EKG EKG Interpretation Date/Time:  Sunday January 02 2024 07:16:52 EDT Ventricular Rate:  99 PR Interval:  133 QRS Duration:  89 QT Interval:  329 QTC Calculation: 423 R Axis:   69  Text Interpretation: Sinus rhythm Confirmed by Beckey Downing [id number]) on 01/02/2024 7:46:02 AM  Radiology CT ABDOMEN PELVIS WO CONTRAST Result Date: 01/02/2024 CLINICAL DATA:  Abdominal/flank pain, stone suspected. EXAM: CT ABDOMEN AND PELVIS WITHOUT CONTRAST TECHNIQUE: Multidetector CT imaging of the abdomen and pelvis was performed following the standard protocol without IV contrast. RADIATION DOSE REDUCTION: This exam was performed according to the departmental dose-optimization program which includes automated exposure control, adjustment of the mA and/or kV according to patient size and/or use of iterative reconstruction technique. COMPARISON:  06/29/2022. FINDINGS: Lower chest: There is a 5 mm nodule in the right lower lobe, axial image 36, unchanged from previous exam. Hepatobiliary: A coarse calcification is noted in the posterior right lobe of the liver. No biliary ductal dilatation. The gallbladder is  without stones. Pancreas: Unremarkable. No pancreatic ductal dilatation or surrounding inflammatory changes. Spleen: Normal in size without focal abnormality. Adrenals/Urinary Tract: The adrenal glands are within normal limits. No renal or ureteral calculus or obstructive uropathy bilaterally. Bladder is unremarkable. Stomach/Bowel: Stomach is within normal limits. Appendix appears normal. No evidence of bowel wall thickening, distention, or inflammatory changes. No free air or pneumatosis. Vascular/Lymphatic: No significant vascular findings are present. No enlarged abdominal or pelvic lymph nodes. Reproductive: Uterus and bilateral adnexa are stable. There is a 1.7 cm cyst in the left ovary. No additional imaging is recommended. Other: No abdominopelvic ascites. A small umbilical hernia is present. Musculoskeletal: Degenerative changes are present in the thoracolumbar spine. No acute osseous abnormality. IMPRESSION: 1. No renal calculus or obstructive uropathy bilaterally. 2. Stable 5 mm nodule in the right lower lobe. No follow-up needed if patient is low-risk.This recommendation follows the consensus statement: Guidelines for  Management of Incidental Pulmonary Nodules Detected on CT Images: From the Fleischner Society 2017; Radiology 2017; 284:228-243. Electronically Signed   By: Thornell Sartorius M.D.   On: 01/02/2024 12:20   MR LUMBAR SPINE WO CONTRAST Result Date: 01/02/2024 CLINICAL DATA:  43 year old female with back pain and fever. EXAM: MRI LUMBAR SPINE WITHOUT CONTRAST TECHNIQUE: Multiplanar, multisequence MR imaging of the lumbar spine was performed. No intravenous contrast was administered. COMPARISON:  Thoracic spine MRI today reported separately. Previous lumbar MRI 11/27/2023 FINDINGS: Segmentation: Transitional lumbosacral anatomy becomes apparent when numbering from the skull base. This is a different numbering system than use on 11/27/2023. Correlation with radiographs is recommended prior to any  operative intervention. Fully lumbarized S1 level, full size S1-S2 disc space. Alignment: Stable and fairly normal lumbar lordosis. Minimal levoconvex lumbar scoliosis. Vertebrae: Stable. Normal background bone marrow signal. Chronic lower thoracic and upper lumbar endplate Schmorl's nodes. Maintained vertebral height. No marrow edema or evidence of acute osseous abnormality. Intact visible sacrum and SI joints. Conus medullaris and cauda equina: Conus extends to the L1-L2 level. No lower spinal cord or conus signal abnormality. Capacious spinal canal and normal cauda equina nerve roots. Paraspinal and other soft tissues: Negative. Negative lumbar epidural space. Disc levels: Circumferential disc bulge and facet hypertrophy at L5-S1 as detailed on 11/27/2023 (designated L4-L5 at that time). No spinal or lateral recess stenosis. Mild L5 neural foraminal stenosis. S1-S2: Lumbarized.  Facet hypertrophy.  No stenosis. IMPRESSION: 1. 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 Lumbarized S1 and full size S1-S2 disc. Correlation with radiographs is recommended prior to any operative intervention. 2. No acute or inflammatory process in the lumbar spine. Stable lower lumbar degeneration without stenosis. Electronically Signed   By: Odessa Fleming M.D.   On: 01/02/2024 11:47   MR THORACIC SPINE WO CONTRAST Result Date: 01/02/2024 CLINICAL DATA:  43 year old female with back pain and fever. EXAM: MRI THORACIC SPINE WITHOUT CONTRAST TECHNIQUE: Multiplanar, multisequence MR imaging of the thoracic spine was performed. No intravenous contrast was administered. COMPARISON:  Cervical MRI 09/16/2023. 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 Thoracic spine segmentation:  Appears to be normal. Alignment:  Relatively normal thoracic kyphosis. Vertebrae: Normal background bone marrow  signal. Chronic and degenerative endplate irregularity in the lower thoracic spine from T10 through the upper lumbar spine most resembles chronic Schmorl's nodes. No marrow edema or evidence of acute osseous abnormality. Cord: Normal despite multilevel thoracic spine degeneration detailed below. Capacious underlying spinal canal. No thoracic spinal cord signal abnormality. Conus medullaris appears to be normal at L1-L2. Paraspinal and other soft tissues: Negative. Disc levels: T1-T2: Negative. T2-T3: Mild facet hypertrophy.  No stenosis. T3-T4: Negative. T4-T5: Subtle right paracentral disc bulge or protrusion. No stenosis. T5-T6: Minimal disc bulging.  No stenosis. T6-T7: Mild broad-based posterior disc bulge.  No stenosis. T7-T8: Moderate sized central disc extrusion with some cephalad and caudal migration of disc (series 3, image 9 and series 6, image 23. Effaced ventral CSF space but no spinal stenosis. Mild ventral cord mass effect. Facet hypertrophy with degenerative facet joint fluid. No foraminal stenosis. T8-T9: Smaller right paracentral disc protrusion. Effaced ventral CSF space. Trace degenerative facet joint fluid. No stenosis. T9-T10: Mild posterior disc bulging.  No stenosis. T10-T11: Larger and more broad-based posterior disc bulge or protrusion on series 6, image 32. Effaced ventral CSF space. Mild facet hypertrophy. No spinal stenosis. Mild right T10 foraminal  stenosis. T11-T12: Circumferential disc bulge with a broad-based left paracentral component posteriorly. Mild facet hypertrophy. No stenosis. T12-L1: Negative. IMPRESSION: 1. Age advanced Thoracic spine degeneration but no acute or inflammatory process identified. 2. Multilevel disc degeneration with a moderate sized disc herniation at T7-T8. But capacious spinal canal and no thoracic spinal stenosis. Mild right T10 foraminal stenosis. Electronically Signed   By: Odessa Fleming M.D.   On: 01/02/2024 11:42   DG Chest Portable 1 View Result Date:  01/02/2024 CLINICAL DATA:  43 year old female with pain.  Left side rib pain. EXAM: PORTABLE CHEST 1 VIEW COMPARISON:  Chest radiographs 06/29/2022 and earlier. FINDINGS: Portable AP upright view at 0908 hours. Lung volumes and mediastinal contours are normal. Visualized tracheal air column is within normal limits. Allowing for portable technique the lungs are clear. No pneumothorax or pleural effusion. No osseous abnormality identified.  Paucity of bowel gas. IMPRESSION: Negative portable chest. Electronically Signed   By: Odessa Fleming M.D.   On: 01/02/2024 10:24    Procedures Procedures    Medications Ordered in ED Medications  lactated ringers bolus 1,000 mL (0 mLs Intravenous Stopped 01/02/24 0930)  lactated ringers bolus 1,000 mL (1,000 mLs Intravenous New Bag/Given 01/02/24 0940)    ED Course/ Medical Decision Making/ A&P                                 Medical Decision Making 43 year old female with past medical history of asthma, hyperlipidemia, and chronic back pain presenting to the emergency department today with left-sided flank pain.  I will further evaluate the patient here with basic labs as well as an EKG and D-dimer in addition to a chest x-ray to further evaluate for pulmonary edema, pulmonary infiltrates, or pneumothorax.  This also evaluate for pulmonary embolism.  Also obtain LFTs and a lipase to evaluate for hepatobiliary pathology or pancreatitis.  I will obtain a urinalysis here as well to evaluate for UTI/pyelonephritis.  She does have the CVA tenderness so I will likely order a CT scan of her abdomen to further evaluate for ureterolithiasis but will hold off until her D-dimer is resulted.  She is offered pain medication but declines.  Will give her IV fluids for the elevated heart rate.  Also obtain a lactic acid to screen for sepsis.  The patient is providing further history that she has had some intermittent urinary incontinence as well as issues with constipation that has been  going on for months and did not really seem to coincide with her acute pain.  She is reporting of the pain does run up and down her back to light of the symptoms as well as reports of fevers at home will obtain MRI to evaluate for infectious etiology as well as to evaluate for cauda equina syndrome.  The patient's workup here is reassuring.  Her lactic acid is mildly elevated.  This cleared with IV fluids and no other acute interventions.  Suspicion for infectious etiology is low as all of her initial workup is unremarkable.  MRI shows a disc protrusion but no findings consistent with central cord process or cauda equina syndrome or any other evidence of discitis or osteomyelitis.  I think she is stable for discharge.  She remains neuro intact.  She does have a follow-up appointment with neurosurgery within the month.  Amount and/or Complexity of Data Reviewed Labs: ordered. Radiology: ordered.  Risk Prescription drug management.  Final Clinical Impression(s) / ED Diagnoses Final diagnoses:  Protrusion of thoracic intervertebral disc    Rx / DC Orders ED Discharge Orders          Ordered    methocarbamol (ROBAXIN) 750 MG tablet  Every 6 hours PRN        01/02/24 1302              Durwin Glaze, MD 01/02/24 1303

## 2024-01-02 NOTE — ED Triage Notes (Signed)
 Patient reports Thursday started having left rib cage pain that goes up her spine.  Reports low grade fever of 99.2-99.3  Reports any exertion she gets fatigue and sob. +nausea

## 2024-01-03 DIAGNOSIS — J4520 Mild intermittent asthma, uncomplicated: Secondary | ICD-10-CM | POA: Diagnosis not present

## 2024-01-03 DIAGNOSIS — F418 Other specified anxiety disorders: Secondary | ICD-10-CM | POA: Diagnosis not present

## 2024-01-03 DIAGNOSIS — M797 Fibromyalgia: Secondary | ICD-10-CM | POA: Diagnosis not present

## 2024-01-03 IMAGING — CR DG CHEST 2V
2 series · 2 of 2 positions shown · non-contrast
Comparison: Chest x-ray 05/26/2021.

CLINICAL DATA: Cough.

EXAM:
CHEST - 2 VIEW

[w chest pa]
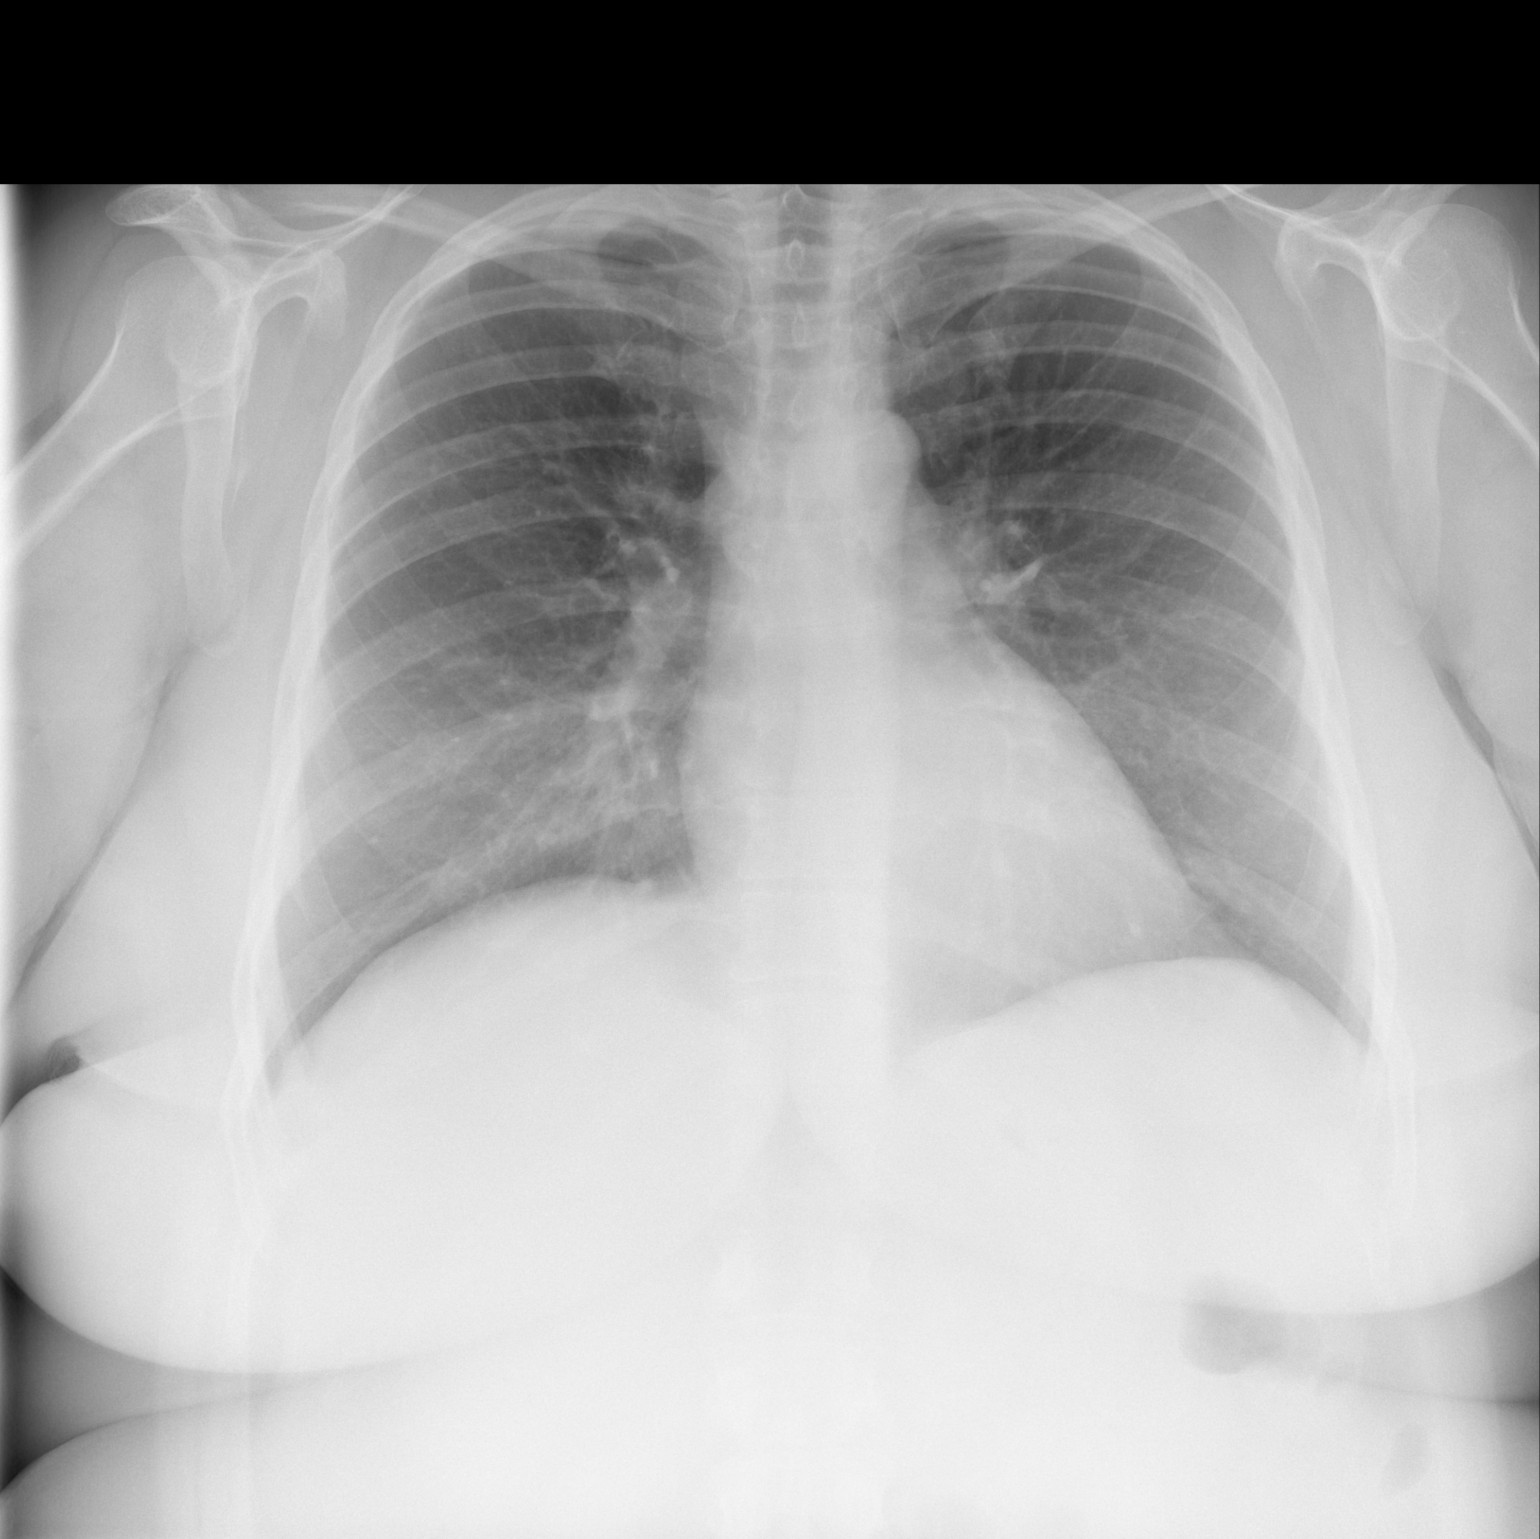

[w chest lat]
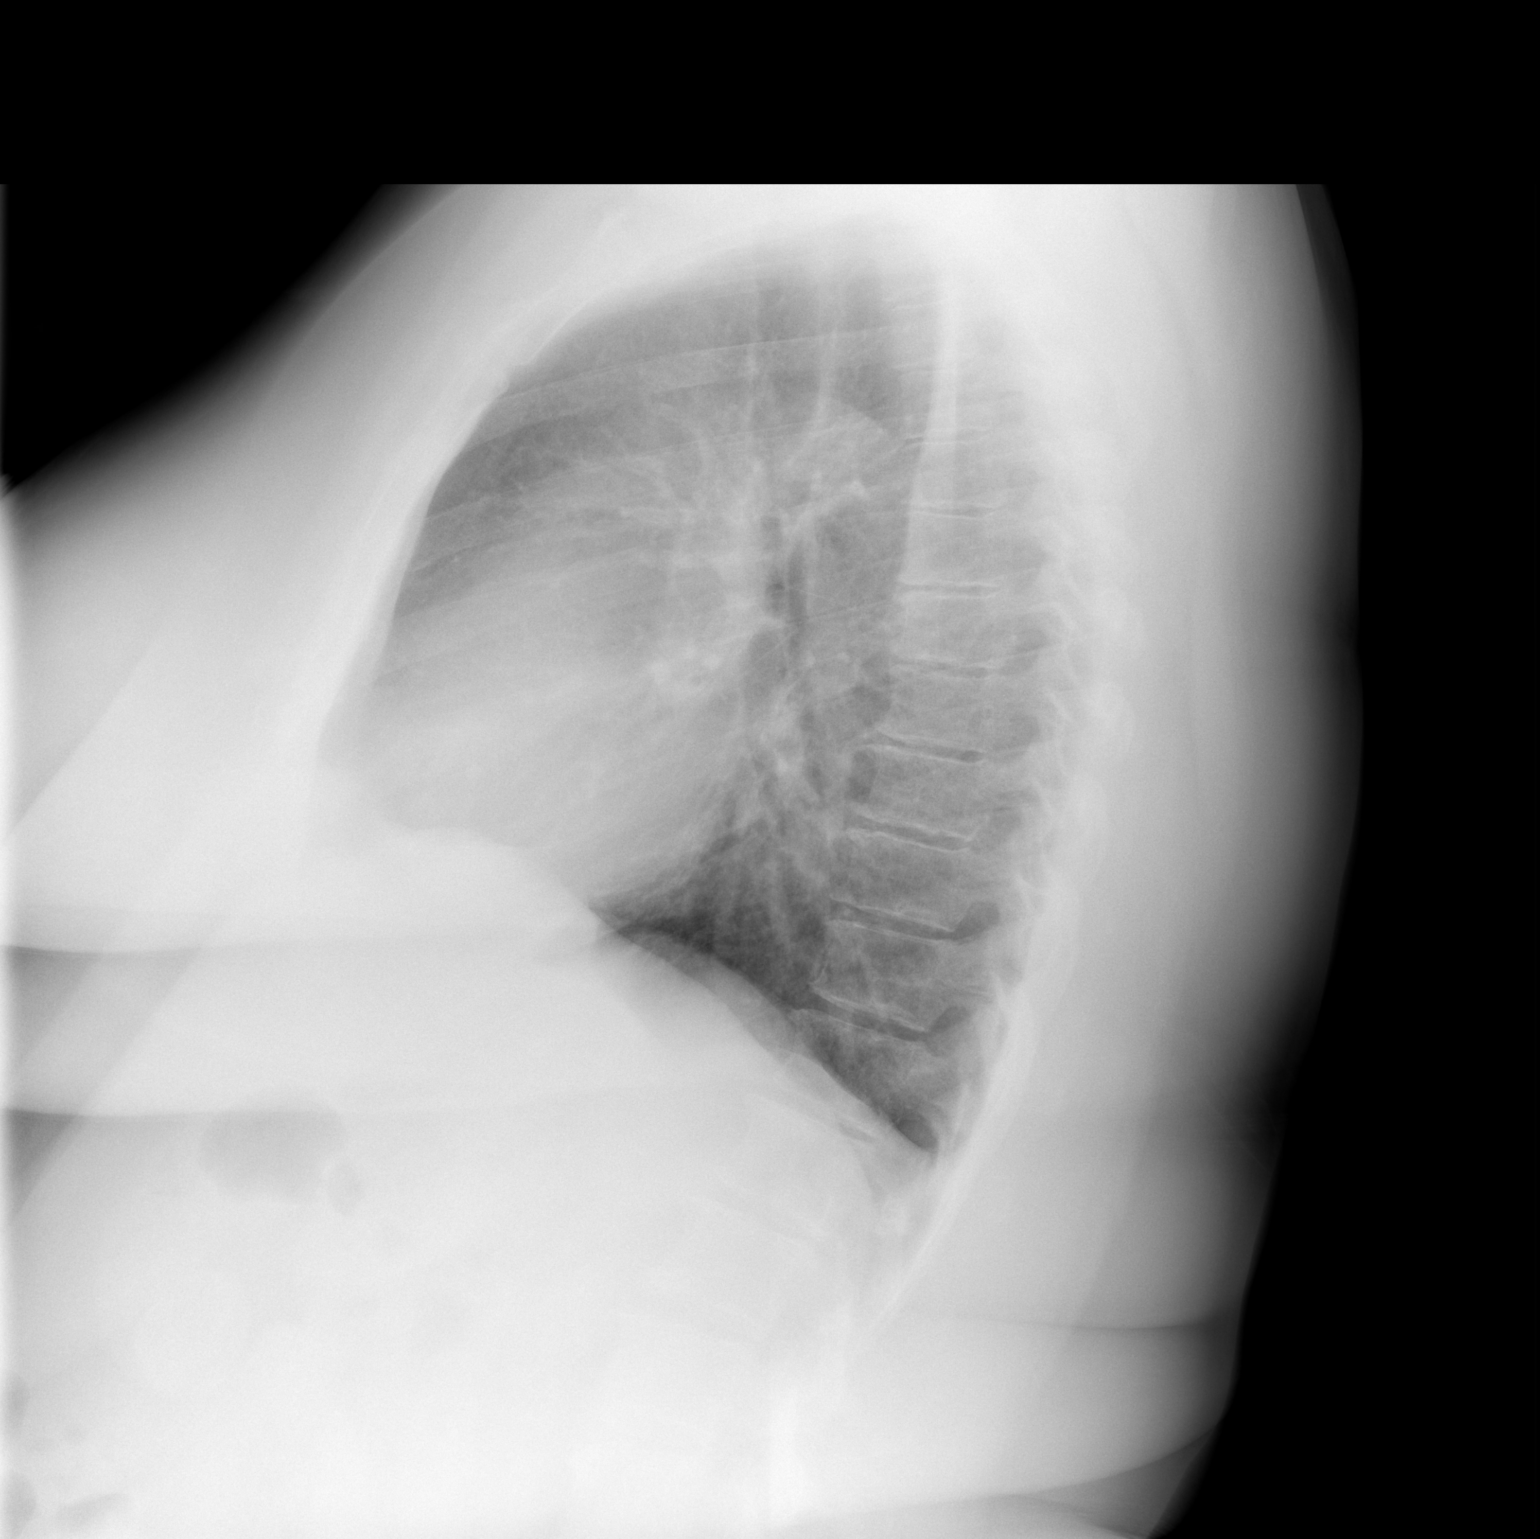

[2 of 2 positions shown; findings below may reference images not displayed]

FINDINGS: The heart size and mediastinal contours are within normal limits.
Both lungs are clear. The visualized skeletal structures are
unremarkable.
IMPRESSION: No active cardiopulmonary disease.

## 2024-01-04 DIAGNOSIS — Z6841 Body Mass Index (BMI) 40.0 and over, adult: Secondary | ICD-10-CM | POA: Diagnosis not present

## 2024-01-04 DIAGNOSIS — M4722 Other spondylosis with radiculopathy, cervical region: Secondary | ICD-10-CM | POA: Diagnosis not present

## 2024-01-10 ENCOUNTER — Encounter: Payer: Self-pay | Admitting: Physical Medicine & Rehabilitation

## 2024-01-10 DIAGNOSIS — M546 Pain in thoracic spine: Secondary | ICD-10-CM | POA: Diagnosis not present

## 2024-01-10 DIAGNOSIS — M9902 Segmental and somatic dysfunction of thoracic region: Secondary | ICD-10-CM | POA: Diagnosis not present

## 2024-01-10 DIAGNOSIS — G894 Chronic pain syndrome: Secondary | ICD-10-CM | POA: Diagnosis not present

## 2024-01-10 DIAGNOSIS — M542 Cervicalgia: Secondary | ICD-10-CM | POA: Diagnosis not present

## 2024-01-10 DIAGNOSIS — G4733 Obstructive sleep apnea (adult) (pediatric): Secondary | ICD-10-CM | POA: Diagnosis not present

## 2024-01-10 DIAGNOSIS — M9903 Segmental and somatic dysfunction of lumbar region: Secondary | ICD-10-CM | POA: Diagnosis not present

## 2024-01-10 DIAGNOSIS — M9908 Segmental and somatic dysfunction of rib cage: Secondary | ICD-10-CM | POA: Diagnosis not present

## 2024-01-18 NOTE — Progress Notes (Unsigned)
 NEUROLOGY FOLLOW UP OFFICE NOTE  Madison Waters [id number]  Assessment/Plan:   Migraine without aura, without status migrainosus, not intractable Weakness, neck/back pain, gait instability, myalgias, polyarthralgias.  Unclear etiology suspicious of a more of a systemic process, particularly autoimmune Subjective memory and speech difficulty - unclear if related to low B12.   Migraine prevention:  Aimovig  140mg   Migraine rescue:  sumatriptan  100mg , Nurtec Follow up with rheumatology Follow up with me ***  Total time spent in chart and face to face with patient:  43 minutes      Subjective:  Madison Waters is a 43 year old female with asthma, iron-deficiency anemia, and tinnitus who follows up for migraines and other symptomology.  MRI of brain, thoracic and lumbar spine personally reviewed.  UPDATE: Migraines: Intensity:  moderate to severe Duration:  1 hour to several hours with sumatriptan  Frequency:  2 migraines in last 3 months.  Tend to occur leading up to menses Current NSAIDS/analgesics:  none Current triptans:  sumatriptan  100mg  Current ergotamine:  none Current anti-emetic:  Zofran  4mg ; promethazine  25mg  (sometimes) Current muscle relaxants:  none Current Antihypertensive medications:  none Current Antidepressant medications:  duloxetine 60mg  daily, trazodone 100mg  (insomnia) Current Anticonvulsant medications:  none Current anti-CGRP:  Aimovig  140mg  Current Vitamins/Herbal/Supplements:  ferrous sulfate Current Antihistamines/Decongestants:  meclizine, zyrtec, Flonase Other therapy:  ice pack Hormone/birth control:  none  Other symptomatology: Underwent workup. Labs from 11/11/2023 again revealed positive ANA (1:80, speckled), negative ENA panel, CK 70, aldolase 5.2, TSH 2.40, folate 10.8 and B12 308, Advised to start OTC B12 1000mcg daily.  MRI of brain with and without contrast on 12/12/2023 was normal.    MRI of lumbar spine without contrast on 11/27/2023 showed  lower lumbar degeneration with desiccation and mild disc bulging and mild facet and ligamentous hypertrophy at L4-5 and minimal face osteoarthritis at L5-S1 without canal or foraminal stenosis.  She has seen rheumatology ***.  She finished PT.  ***.  She presented to the ED on 01/02/2024 with left sided flank pain and shortness of breath.  MRI of thoracic spine without contrast revealed age-advanced multilevel degenerative spine and disc disease with moderate sized disc herniation at T7-T8 without spinal stenosis and mild right T10 foraminal stenosis.  She had another MRI of lumbar spine without contrast now showed apparent transitional lumbosacral anatomy with lumbarized S1 and full size S1-S2 disc and lower lumbar degeneration without stenosis or acute or inflammatory process.  CXR, CT A/P and d-dimer negative.  Discharged on methocarbamol .     Caffeine:  no Diet:  3 liters of water daily.  Avoids soda, spicy foods, citrus, cut out most sugar, may skip lunch Exercise:  not routine Depression:  stable; Anxiety:  stable Other pain:  joint pain, myalgia Sleep hygiene:  varies.  May take an hour to fall asleep and may wake up several times during the night.  Wakes up with migraines (3 AM)  HISTORY:  Migraines: Onset:  43 years old.  Became daily since having COVID in end of August 2023. Location:  varies - temples, behind eyes, back of neck Quality:  initially pressure but progresses to throbbing/pounding Intensity:  Severe Aura:  absent Prodrome:  absent Associated symptoms:  Nausea, photophobia, phonophobia, scalp tenderness, joint pain, sometimes blurred vision.  She denies associated unilateral numbness or weakness. Duration:  1 to 2 hours if treated early, otherwise all day Frequency:  Prior to August 2023 - 5-10 times a month; Since August 2023 - daily Frequency of  abortive medication: Tylenol and/or Excedrin daily Triggers:  menses, change in weather, sleep deprivation, stress Relieving  factors:  ice pack Activity:  affects daily activity.     Past NSAIDS/analgesics:  ibuprofen (GI problems), Tylenol, Excedrin Past abortive triptans:  rizatriptan Past abortive ergotamine:  none Past muscle relaxants:  none Past anti-emetic:  Zofran  Past antihypertensive medications: none Past antidepressant medications:  venlafaxine (side effects) Past anticonvulsant medications:  topiramate (adverse reaction) Past anti-CGRP:  Ubrelvy 100mg  (not helpful) Other past therapies:  none  Other symptomatology including weakness, neck/back pain, gait instability, memory and speech problems, myalgias, polyarthralgias. She has been having multiple problems beginning in 2024.  She has been feeling excessive fatigue.  When she stands up, she fells off balance.  She is tripping over herself.  Notes asymmetric right sided weakness.  Arms and hands may start shaking.  She reports urinary urge incontinence and constipation.  Reports increased cognitive difficulty.  She has word-finding difficulty, difficulty remembering how to spell some words.  Trouble multitasking.  She notes sensory changes.  Last 3 fingers of each hand tingles.  Fingers go numb.  Hands feel cold.  Big toe feels numb.  Feet tingle.  Sometimes her fingers and toes become white.  Reports diffuse joint pain and muscle aches.  She has been experiencing neck and back pain.  MRI of of cervical spine on 09/16/2023 personally reviewed revealed mild degenerative changes particularly at C3-4 and C5-6 but no significant stenosis.  Treated with PT as well as epidural injections.  Has been in physical therapy, including aquatic therapy.  ANA was positive (1:80 titer, heterogeneous).  Concerned about MS.  She has been unable to work full-time.  Most family history is unknown as she was adopted.    PAST MEDICAL HISTORY: Past Medical History:  Diagnosis Date   Anemia    Asthma    GERD (gastroesophageal reflux disease)    High cholesterol     Migraines     MEDICATIONS: Current Outpatient Medications on File Prior to Visit  Medication Sig Dispense Refill   ADVAIR DISKUS 250-50 MCG/ACT AEPB Inhale 1 puff into the lungs 2 (two) times daily.     albuterol (VENTOLIN HFA) 108 (90 Base) MCG/ACT inhaler Inhale 2 puffs into the lungs every 6 (six) hours as needed.     amoxicillin -clavulanate (AUGMENTIN ) 875-125 MG tablet Take 1 tablet by mouth 2 (two) times daily. 14 tablet 0   DULoxetine (CYMBALTA) 60 MG capsule Take 60 mg by mouth daily.     Erenumab -aooe (AIMOVIG ) 140 MG/ML SOAJ Inject 140 mg into the skin every 28 (twenty-eight) days. 1.12 mL 11   methocarbamol  (ROBAXIN ) 750 MG tablet Take 1 tablet (750 mg total) by mouth every 6 (six) hours as needed for muscle spasms. 30 tablet 0   pantoprazole (PROTONIX) 40 MG tablet Take 40 mg by mouth 2 (two) times daily.     promethazine  (PHENERGAN ) 25 MG tablet Take 1 tablet (25 mg total) by mouth every 6 (six) hours as needed for nausea or vomiting. 30 tablet 5   sucralfate (CARAFATE) 1 g tablet Take 1 g by mouth 2 (two) times daily.     SUMAtriptan  (IMITREX ) 100 MG tablet Take 1 tablet (100 mg total) by mouth as needed for migraine. May repeat in 2 hours if headache persists or recurs.  Maximum 2 tablets in 24 hours. 10 tablet 5   traZODone (DESYREL) 100 MG tablet Take 100 mg by mouth at bedtime.     No current facility-administered  medications on file prior to visit.    ALLERGIES: Allergies  Allergen Reactions   Sulfa Antibiotics Hives and Rash    FAMILY HISTORY: Family History  Problem Relation Age of Onset   Stroke Mother       Objective:  *** General: No acute distress.  Patient appears well-groomed.   ***   Janne Members, DO  CC: Angelyn Kennel, NP

## 2024-01-19 ENCOUNTER — Ambulatory Visit: Payer: No Typology Code available for payment source | Admitting: Neurology

## 2024-01-19 ENCOUNTER — Encounter: Payer: Self-pay | Admitting: Neurology

## 2024-01-19 VITALS — BP 118/74 | HR 100 | Ht 64.0 in | Wt 253.0 lb

## 2024-01-19 DIAGNOSIS — M791 Myalgia, unspecified site: Secondary | ICD-10-CM | POA: Diagnosis not present

## 2024-01-19 DIAGNOSIS — G43009 Migraine without aura, not intractable, without status migrainosus: Secondary | ICD-10-CM

## 2024-01-19 DIAGNOSIS — R202 Paresthesia of skin: Secondary | ICD-10-CM

## 2024-01-19 MED ORDER — NURTEC 75 MG PO TBDP
1.0000 | ORAL_TABLET | Freq: Every day | ORAL | 11 refills | Status: DC | PRN
Start: 2024-01-19 — End: 2024-02-02

## 2024-01-19 NOTE — Patient Instructions (Signed)
 Nerve study left arm and right leg Aimovig  every 4 weeks Sumatriptan  and/or Nurtec Follow up 6 months.

## 2024-01-21 ENCOUNTER — Ambulatory Visit: Payer: No Typology Code available for payment source | Admitting: Neurology

## 2024-01-24 ENCOUNTER — Telehealth: Payer: Self-pay

## 2024-01-24 NOTE — Telephone Encounter (Signed)
 PA needed for Nurtec.

## 2024-01-25 ENCOUNTER — Other Ambulatory Visit (HOSPITAL_COMMUNITY): Payer: Self-pay

## 2024-01-25 ENCOUNTER — Telehealth: Payer: Self-pay | Admitting: Pharmacy Technician

## 2024-01-25 NOTE — Telephone Encounter (Signed)
 PA has been submitted, and telephone encounter has been created. Please see telephone encounter dated 4.29.25.

## 2024-01-25 NOTE — Telephone Encounter (Signed)
 Pharmacy Patient Advocate Encounter  Received notification from [facility] that Prior Authorization for NURTEC 75MG  has been APPROVED from 4.29.25 to 4.29.26. Ran test claim, Copay is $0. This test claim was processed through [facility]- copay amounts may vary at other pharmacies due to pharmacy/plan contracts, or as the patient moves through the different stages of their insurance plan.   PA #/Case ID/Reference #: [id number]

## 2024-01-25 NOTE — Telephone Encounter (Signed)
 Pharmacy Patient Advocate Encounter   Received notification from Pt Calls Messages that prior authorization for NURTEC 75MG  is required/requested.   Insurance verification completed.   The patient is insured through [facility] .   Per test claim: PA required; PA submitted to above mentioned insurance via CoverMyMeds Key/confirmation #/EOC [id number] Status is pending

## 2024-01-26 DIAGNOSIS — R7301 Impaired fasting glucose: Secondary | ICD-10-CM | POA: Diagnosis not present

## 2024-01-26 DIAGNOSIS — Z0000 Encounter for general adult medical examination without abnormal findings: Secondary | ICD-10-CM | POA: Diagnosis not present

## 2024-01-26 DIAGNOSIS — Z862 Personal history of diseases of the blood and blood-forming organs and certain disorders involving the immune mechanism: Secondary | ICD-10-CM | POA: Diagnosis not present

## 2024-01-27 ENCOUNTER — Ambulatory Visit: Admitting: Neurology

## 2024-01-27 DIAGNOSIS — R202 Paresthesia of skin: Secondary | ICD-10-CM

## 2024-01-27 NOTE — Procedures (Signed)
 [facility]  [address] [place], [address]  [place], [place] [address] Tel: [phone] Fax: [phone] Test Date:  01/27/2024  Patient: Madison Waters DOB: [birth year] Physician: Reyna Cava, DO  Sex: Female Height: 5\' 4"  Ref Phys: Janne Members, DO  ID#: [id number]   Technician:    History: This is a 43 year old female referred for evaluation of generalized weakness and paresthesias involving the arms and legs.  NCV & EMG Findings: Extensive electrodiagnostic testing of the left upper extremity and right lower extremity shows:  All sensory responses including the left median, left ulnar, left mixed palmar, right sural, and right superficial peroneal nerves are within normal limits. All motor responses including the left median, left ulnar, right peroneal, and right tibial motor responses are within normal limits. Right tibial H reflex study is within normal limits.   There is no evidence of active or chronic motor axonal loss changes affecting any of the tested muscles.  Motor unit configuration and recruitment pattern is within normal limits.  Impression: This is a normal study of the left upper and right lower extremities.  In particular, there is no evidence of a cervical/lumbosacral radiculopathy, carpal tunnel syndrome, or a large fiber sensorimotor polyneuropathy.   ___________________________ Reyna Cava, DO    Nerve Conduction Studies   Stim Site NR Peak (ms) Norm Peak (ms) O-P Amp (V) Norm O-P Amp  Left Median Anti Sensory (2nd Digit)  32 C  Wrist    2.7 <3.4 75.1 >20  Right Sup Peroneal Anti Sensory (Ant Lat Mall)  32 C  12 cm    2.4 <4.5 12.0 >5  Right Sural Anti Sensory (Lat Mall)  32 C  Calf    2.0 <4.5 21.1 >5  Left Ulnar Anti Sensory (5th Digit)  32 C  Wrist    2.5 <3.1 46.8 >12     Stim Site NR Onset (ms) Norm Onset (ms) O-P Amp (mV) Norm O-P Amp Site1 Site2 Delta-0 (ms) Dist (cm) Vel (m/s) Norm Vel (m/s)  Left Median Motor (Abd Poll Brev)  32  C  Wrist    3.3 <3.9 9.7 >6 Elbow Wrist 4.8 31.0 65 >50  Elbow    8.1  9.2         Right Peroneal Motor (Ext Dig Brev)  32 C  Ankle    3.0 <5.5 5.8 >3 B Fib Ankle 7.0 36.0 51 >40  B Fib    10.0  5.0  Poplt B Fib 1.5 8.0 53 >40  Poplt    11.5  4.8         Right Tibial Motor (Abd Hall Brev)  32 C  Ankle    3.5 <6.0 13.8 >8 Knee Ankle 8.0 42.0 53 >40  Knee    11.5  11.6         Left Ulnar Motor (Abd Dig Minimi)  32 C  Wrist    2.0 <3.1 10.2 >7 B Elbow Wrist 3.5 22.0 63 >50  B Elbow    5.5  9.2  A Elbow B Elbow 1.5 10.0 67 >50  A Elbow    7.0  8.4            Stim Site NR Peak (ms) Norm Peak (ms) P-T Amp (V) Site1 Site2 Delta-P (ms) Norm Delta (ms)  Left Median/Ulnar Palm Comparison (Wrist - 8cm)  32 C  Median Palm    1.4 <2.2 80.0 Median Palm Ulnar Palm 0.1   Ulnar Palm    1.3 <2.2  19.5       Electromyography   Side Muscle Ins.Act Fibs Fasc Recrt Amp Dur Poly Activation Comment  Left 1stDorInt Nml Nml Nml Nml Nml Nml Nml Nml N/A  Left PronatorTeres Nml Nml Nml Nml Nml Nml Nml Nml N/A  Left Biceps Nml Nml Nml Nml Nml Nml Nml Nml N/A  Left Triceps Nml Nml Nml Nml Nml Nml Nml Nml N/A  Left Deltoid Nml Nml Nml Nml Nml Nml Nml Nml N/A  Right AntTibialis Nml Nml Nml Nml Nml Nml Nml Nml N/A  Right Gastroc Nml Nml Nml Nml Nml Nml Nml Nml N/A  Right Flex Dig Long Nml Nml Nml Nml Nml Nml Nml Nml N/A  Right RectFemoris Nml Nml Nml Nml Nml Nml Nml Nml N/A  Right GluteusMed Nml Nml Nml Nml Nml Nml Nml Nml N/A      Waveforms:

## 2024-01-28 ENCOUNTER — Encounter: Payer: Self-pay | Admitting: Neurology

## 2024-01-28 NOTE — Progress Notes (Signed)
 Patient seen results on mychart. Please see mychart message to [facility] from patient.

## 2024-02-01 ENCOUNTER — Telehealth: Payer: Self-pay

## 2024-02-01 DIAGNOSIS — M791 Myalgia, unspecified site: Secondary | ICD-10-CM

## 2024-02-01 DIAGNOSIS — R202 Paresthesia of skin: Secondary | ICD-10-CM

## 2024-02-01 NOTE — Telephone Encounter (Signed)
-----   Message from Nathaniel Bald sent at 01/30/2024  4:23 PM EDT ----- Symptoms may be fibromyalgia.  We can refer to [facility] for second opinion regarding neck, hip,feet and ankle pain ----- Message ----- From: Patel, Donika K, DO Sent: 01/27/2024   4:21 PM EDT To: Merriam Abbey, DO

## 2024-02-01 NOTE — Telephone Encounter (Signed)
 Referral faxed and sent via epic

## 2024-02-01 NOTE — Progress Notes (Unsigned)
 Chief Complaint: Primary GI [id number]  HPI: 43 year old with history as below presents for evaluation of.  Previous patient of [facility] seen by Valiant Gaul PA-C 05/2022 for epigastric pain. We do not have this record available.  Discussed the use of AI scribe software for clinical note transcription with the patient, who gave verbal consent to proceed.  History of Present Illness      PREVIOUS GI WORKUP     Past Medical History:  Diagnosis Date   Anemia    Asthma    GERD (gastroesophageal reflux disease)    High cholesterol    Migraines     Past Surgical History:  Procedure Laterality Date   TONSILLECTOMY     WRIST FRACTURE SURGERY Left     Current Outpatient Medications  Medication Sig Dispense Refill   ADVAIR DISKUS 250-50 MCG/ACT AEPB Inhale 1 puff into the lungs 2 (two) times daily.     albuterol (VENTOLIN HFA) 108 (90 Base) MCG/ACT inhaler Inhale 2 puffs into the lungs every 6 (six) hours as needed.     DULoxetine (CYMBALTA) 30 MG capsule Take 90 mg by mouth daily.     Erenumab -aooe (AIMOVIG ) 140 MG/ML SOAJ Inject 140 mg into the skin every 28 (twenty-eight) days. 1.12 mL 11   methocarbamol  (ROBAXIN ) 500 MG tablet Take 500 mg by mouth 3 (three) times daily as needed.     pantoprazole (PROTONIX) 40 MG tablet Take 40 mg by mouth 2 (two) times daily.     pregabalin (LYRICA) 75 MG capsule Take 75 mg by mouth 2 (two) times daily.     promethazine  (PHENERGAN ) 25 MG tablet Take 1 tablet (25 mg total) by mouth every 6 (six) hours as needed for nausea or vomiting. 30 tablet 5   Rimegepant Sulfate (NURTEC) 75 MG TBDP Take 1 tablet (75 mg total) by mouth daily as needed. 8 tablet 11   sucralfate (CARAFATE) 1 g tablet Take 1 g by mouth 2 (two) times daily.     SUMAtriptan  (IMITREX ) 100 MG tablet Take 1 tablet (100 mg total) by mouth as needed for migraine. May repeat in 2 hours if headache persists or recurs.  Maximum 2 tablets in 24 hours. 10 tablet 5   traZODone  (DESYREL) 100 MG tablet Take 100 mg by mouth at bedtime.     ZEPBOUND 2.5 MG/0.5ML Pen Inject 2.5 mg into the skin once a week.     No current facility-administered medications for this visit.    Allergies as of 02/02/2024 - Review Complete 01/19/2024  Allergen Reaction Noted   Sulfa antibiotics Hives and Rash 04/11/2021    Family History  Problem Relation Age of Onset   Stroke Mother     Social History   Socioeconomic History   Marital status: Married    Spouse name: Not on file   Number of children: Not on file   Years of education: Not on file   Highest education level: Not on file  Occupational History   Not on file  Tobacco Use   Smoking status: Never   Smokeless tobacco: Never  Vaping Use   Vaping status: Never Used  Substance and Sexual Activity   Alcohol use: Not Currently   Drug use: Never   Sexual activity: Not on file  Other Topics Concern   Not on file  Social History Narrative   Right handed   Social Drivers of Health   Financial Resource Strain: Not on file  Food Insecurity: Not on  file  Transportation Needs: Not on file  Physical Activity: Not on file  Stress: Not on file  Social Connections: Unknown (01/28/2022)   Received from [facility]   Social Network    Social Network: Not on file  Intimate Partner Violence: Unknown (01/02/2022)   Received from [facility], [facility]   HITS    Physically Hurt: Not on file    Insult or Talk Down To: Not on file    Threaten Physical Harm: Not on file    Scream or Curse: Not on file    Review of Systems:    Constitutional: No weight loss, fever, chills, weakness or fatigue HEENT: Eyes: No change in vision               Ears, Nose, Throat:  No change in hearing or congestion Skin: No rash or itching Cardiovascular: No chest pain, chest pressure or palpitations   Respiratory: No SOB or cough Gastrointestinal: See HPI and otherwise negative Genitourinary: No dysuria or change in  urinary frequency Neurological: No headache, dizziness or syncope Musculoskeletal: No new muscle or joint pain Hematologic: No bleeding or bruising Psychiatric: No history of depression or anxiety    Physical Exam:  Vital signs: There were no vitals taken for this visit.  Constitutional: NAD, alert and cooperative Head:  Normocephalic and atraumatic. Eyes:   PEERL, EOMI. No icterus. Conjunctiva pink. Respiratory: Respirations even and unlabored. Lungs clear to auscultation bilaterally.   No wheezes, crackles, or rhonchi.  Cardiovascular:  Regular rate and rhythm. No peripheral edema, cyanosis or pallor.  Gastrointestinal:  Soft, nondistended, nontender. No rebound or guarding. Normal bowel sounds. No appreciable masses or hepatomegaly. Rectal:  Declines Msk:  Symmetrical without gross deformities. Without edema, no deformity or joint abnormality.  Neurologic:  Alert and  oriented x4;  grossly normal neurologically.  Skin:   Dry and intact without significant lesions or rashes. Psychiatric: Oriented to person, place and time. Demonstrates good judgement and reason without abnormal affect or behaviors.  RELEVANT LABS AND IMAGING: CBC    Component Value Date/Time   WBC 9.4 01/02/2024 0759   RBC 4.33 01/02/2024 0759   HGB 12.9 01/02/2024 0759   HCT 39.8 01/02/2024 0759   PLT 318 01/02/2024 0759   MCV 91.9 01/02/2024 0759   MCH 29.8 01/02/2024 0759   MCHC 32.4 01/02/2024 0759   RDW 13.4 01/02/2024 0759   LYMPHSABS 2.2 01/02/2024 0759   MONOABS 0.4 01/02/2024 0759   EOSABS 0.2 01/02/2024 0759   BASOSABS 0.1 01/02/2024 0759    CMP     Component Value Date/Time   NA 139 01/02/2024 0759   K 3.8 01/02/2024 0759   CL 105 01/02/2024 0759   CO2 22 01/02/2024 0759   GLUCOSE 105 (H) 01/02/2024 0759   BUN 11 01/02/2024 0759   CREATININE 1.25 (H) 01/02/2024 0759   CALCIUM 9.3 01/02/2024 0759   PROT 6.3 (L) 01/02/2024 0759   ALBUMIN 3.6 01/02/2024 0759   AST 18 01/02/2024 0759    ALT 13 01/02/2024 0759   ALKPHOS 60 01/02/2024 0759   BILITOT 0.5 01/02/2024 0759   GFRNONAA 54 (L) 01/02/2024 0759     Assessment/Plan:   Assessment and Plan Assessment & Plan        Gigi Kyle [facility] 02/01/2024, 12:15 PM  Cc: Yvonnie Heritage, NP

## 2024-02-02 ENCOUNTER — Encounter: Payer: Self-pay | Admitting: Gastroenterology

## 2024-02-02 ENCOUNTER — Ambulatory Visit (INDEPENDENT_AMBULATORY_CARE_PROVIDER_SITE_OTHER): Admitting: Gastroenterology

## 2024-02-02 VITALS — BP 110/70 | HR 94 | Ht 64.0 in | Wt 250.0 lb

## 2024-02-02 DIAGNOSIS — F418 Other specified anxiety disorders: Secondary | ICD-10-CM | POA: Diagnosis not present

## 2024-02-02 DIAGNOSIS — R1013 Epigastric pain: Secondary | ICD-10-CM | POA: Diagnosis not present

## 2024-02-02 DIAGNOSIS — Z0000 Encounter for general adult medical examination without abnormal findings: Secondary | ICD-10-CM | POA: Diagnosis not present

## 2024-02-02 DIAGNOSIS — K219 Gastro-esophageal reflux disease without esophagitis: Secondary | ICD-10-CM

## 2024-02-02 DIAGNOSIS — R140 Abdominal distension (gaseous): Secondary | ICD-10-CM

## 2024-02-02 DIAGNOSIS — K5909 Other constipation: Secondary | ICD-10-CM

## 2024-02-02 DIAGNOSIS — Z8711 Personal history of peptic ulcer disease: Secondary | ICD-10-CM

## 2024-02-02 DIAGNOSIS — D509 Iron deficiency anemia, unspecified: Secondary | ICD-10-CM | POA: Diagnosis not present

## 2024-02-02 DIAGNOSIS — M797 Fibromyalgia: Secondary | ICD-10-CM | POA: Diagnosis not present

## 2024-02-02 MED ORDER — NA SULFATE-K SULFATE-MG SULF 17.5-3.13-1.6 GM/177ML PO SOLN
1.0000 | Freq: Once | ORAL | 0 refills | Status: AC
Start: 2024-02-02 — End: 2024-02-02

## 2024-02-02 NOTE — Patient Instructions (Addendum)
 A high fiber diet with plenty of fluids (up to 8 glasses of water daily) is suggested to relieve these symptoms.  Metamucil, 1 tablespoon once or twice daily can be used to keep bowels regular if needed.  We have sent the following medications to your pharmacy for you to pick up at your convenience: Suprep  You have been scheduled for an endoscopy and colonoscopy. Please follow the written instructions given to you at your visit today.  If you use inhalers (even only as needed), please bring them with you on the day of your procedure.  DO NOT TAKE 7 DAYS PRIOR TO TEST- Trulicity (dulaglutide) Ozempic, Wegovy (semaglutide) Mounjaro (tirzepatide) Bydureon Bcise (exanatide extended release)  DO NOT TAKE 1 DAY PRIOR TO YOUR TEST  Rybelsus (semaglutide) Adlyxin (lixisenatide) Victoza (liraglutide) Byetta (exanatide) ___________________________________________________________________________  _______________________________________________________  If your blood pressure at your visit was 140/90 or greater, please contact your primary care physician to follow up on this.  _______________________________________________________  If you are age 80 or older, your body mass index should be between 23-30. Your Body mass index is 42.91 kg/m. If this is out of the aforementioned range listed, please consider follow up with your Primary Care Provider.  If you are age 8 or younger, your body mass index should be between 19-25. Your Body mass index is 42.91 kg/m. If this is out of the aformentioned range listed, please consider follow up with your Primary Care Provider.   ________________________________________________________  The [facility] providers would like to encourage you to use MYCHART to communicate with providers for non-urgent requests or questions.  Due to long hold times on the telephone, sending your provider a message by [facility] may be a faster and more efficient way to get a  response.  Please allow 48 business hours for a response.  Please remember that this is for non-urgent requests.  _______________________________________________________

## 2024-02-07 NOTE — Progress Notes (Signed)
 Addendum: Reviewed and agree with assessment and management plan. Asha Grumbine, Carie Caddy, MD

## 2024-02-22 DIAGNOSIS — J45909 Unspecified asthma, uncomplicated: Secondary | ICD-10-CM | POA: Diagnosis not present

## 2024-02-22 DIAGNOSIS — G4701 Insomnia due to medical condition: Secondary | ICD-10-CM | POA: Diagnosis not present

## 2024-02-22 DIAGNOSIS — G894 Chronic pain syndrome: Secondary | ICD-10-CM | POA: Diagnosis not present

## 2024-02-22 DIAGNOSIS — G43909 Migraine, unspecified, not intractable, without status migrainosus: Secondary | ICD-10-CM | POA: Diagnosis not present

## 2024-03-01 ENCOUNTER — Encounter: Payer: Self-pay | Admitting: Physical Medicine & Rehabilitation

## 2024-03-01 ENCOUNTER — Encounter: Attending: Physical Medicine & Rehabilitation | Admitting: Physical Medicine & Rehabilitation

## 2024-03-01 VITALS — BP 101/70 | HR 101 | Ht 64.0 in | Wt 243.8 lb

## 2024-03-01 DIAGNOSIS — G479 Sleep disorder, unspecified: Secondary | ICD-10-CM | POA: Diagnosis not present

## 2024-03-01 DIAGNOSIS — M7918 Myalgia, other site: Secondary | ICD-10-CM | POA: Insufficient documentation

## 2024-03-01 DIAGNOSIS — G43E09 Chronic migraine with aura, not intractable, without status migrainosus: Secondary | ICD-10-CM | POA: Insufficient documentation

## 2024-03-01 DIAGNOSIS — M797 Fibromyalgia: Secondary | ICD-10-CM | POA: Insufficient documentation

## 2024-03-01 DIAGNOSIS — G43909 Migraine, unspecified, not intractable, without status migrainosus: Secondary | ICD-10-CM | POA: Insufficient documentation

## 2024-03-01 MED ORDER — CYCLOBENZAPRINE HCL 5 MG PO TABS: 5.0000 mg | ORAL_TABLET | Freq: Two times a day (BID) | ORAL | 0 refills | Status: DC | PRN

## 2024-03-01 MED ORDER — PREGABALIN 75 MG PO CAPS: 75.0000 mg | ORAL_CAPSULE | Freq: Three times a day (TID) | ORAL | 3 refills | Status: DC

## 2024-03-01 NOTE — Patient Instructions (Signed)
ALWAYS FEEL FREE TO CALL OUR OFFICE WITH ANY PROBLEMS OR QUESTIONS [phone])  **PLEASE NOTE** ALL MEDICATION REFILL REQUESTS (INCLUDING CONTROLLED SUBSTANCES) NEED TO BE MADE AT LEAST 7 DAYS PRIOR TO REFILL BEING DUE. ANY REFILL REQUESTS INSIDE THAT TIME FRAME MAY RESULT IN DELAYS IN RECEIVING YOUR PRESCRIPTION.                    SUPPLEMENTS USEFUL FOR OSTEOARTHRITIS: OMEGA 3 FATTY ACIDS, TURMERIC, GINGER, TART CHERRY EXTRACT, CELERY SEED, GLUCOSAMINE WITH CHONDROITIN

## 2024-03-01 NOTE — Progress Notes (Signed)
 Subjective:    Patient ID: Madison Waters, female    DOB: 1981-09-06, 43 y.o.   MRN: [id number]  HPI  This is a initial office visit for Madison Waters who is a pleasant 43 year old female  with a hx of migraines since age 1. She was referred here for chronic pain and fibromyalgia.  Patient reports onset of pain in February  2024 when she lifted a baby and injured her left shoulder This seemed to exacerbate her chronic neck pain, creating spasms. She tells me that she has DDD of her cervical and thoracic/lumbar spine.MRI of lumbar spine from 3/25 demonstrated DDD and lumbar facet arthropathy. Thoracic MRI with subtle disc findings. Cervical spine demonstrates DDD/spondylosis with small disc protrusion at C5-6 and listhesis at C3-4  She complains of diffuse body plain involving her neck as well as both hips and back in addition to both her feet.  She feels that her pain is constant and sharp in the 7-8 out of 10.  Some of the neck pain can trigger migraine headaches.  Sleep is fair to poor.  Pain generally worsens with activity and improves with rest as well as heat and ice and pacing to a certain extent.  She uses a cane or a walker to help with her balance.   She was previously evaluated for arthralgia, myalgia and positive ANA.  Labs in December 2024 notable for vitamin D level of 39, ANA was positive, rheumatoid factor negative, CRP and sed rate were unremarkable.  Her ANA was repeated and was negative.  Presumptive diagnosis has been fibromyalgia and apparently was reviewed with the patient by her rheumatologist in addition to some basic strategies for management.  Patient is unable to take NSAIDs due to prior GI history.  She does take Cymbalta pregabalin for pain.  She has been on aimovig  for the last year + for migraines which has reduced headache intensity and frequency significantly. She uses nurtec for breakthrough. Her headaches are primarily cervicogenic. She takes lyrica 75mg  bid since  march of this year. She feels that lyrica has helped with tingling in feet as well as numbness in her hands. She is also is on cymbalta 90mg  which was increased this spring. She has been on cymbalta for 2 years. The 90mg  dose has helped her from a mood standpoint. She is initiating more activities. Methocarbamol  is used three x day for spasms. She uses phenergan  for migraines. Trazodone 100mg  is taken at bedtime for sleep. It helps but pain tends to wake her up at night. She has been on that dose since 43yo. She sleeps from 8:30pm to 5am each night. She ends up getting about 5 hours between getting up at night, waking with pain.   She's had therapy at [facility] this Spring for 6 weeks. She worked in pool and was set up on a HEP.  Her primary is an osteopath who has practiced manual medicine and other holistic approaches which have provided her some relief.    Pain Inventory Average Pain 7-8 Pain Right Now 8-9 My pain is constant, sharp, tingling, aching, and throbbing  In the last 24 hours, has pain interfered with the following? General activity 8-9 Relation with others 5-6 Enjoyment of life 10 What TIME of day is your pain at its worst? morning , daytime, and night Sleep (in general) between poor to fair due to pain  Pain is worse with: walking, bending, sitting, inactivity, and standing Pain improves with: rest, heat/ice, and pacing activities  Relief from Meds: 0  use a cane use a walker how many minutes can you walk? 15 ability to climb steps?  yes do you drive?  yes Do you have any goals in this area?  yes  employed # of hrs/week 4 hours  weakness numbness tingling trouble walking dizziness depression anxiety  Any changes since last visit?  no  PCP, Yvonnie Heritage, NP Larrie Po, MD Rheumatologist Dr. Festus Hubert, Neurologist    Family History  Problem Relation Age of Onset   Stroke Mother    Heart disease Maternal Grandmother    Aneurysm Maternal Grandfather     Ovarian cancer Paternal Grandmother    Prostate cancer Paternal Grandfather    Social History   Socioeconomic History   Marital status: Married    Spouse name: Not on file   Number of children: Not on file   Years of education: Not on file   Highest education level: Not on file  Occupational History   Not on file  Tobacco Use   Smoking status: Never   Smokeless tobacco: Never  Vaping Use   Vaping status: Never Used  Substance and Sexual Activity   Alcohol use: Not Currently   Drug use: Never   Sexual activity: Not on file  Other Topics Concern   Not on file  Social History Narrative   Right handed   Social Drivers of Health   Financial Resource Strain: Not on file  Food Insecurity: Not on file  Transportation Needs: Not on file  Physical Activity: Not on file  Stress: Not on file  Social Connections: Unknown (01/28/2022)   Received from [facility]   Social Network    Social Network: Not on file   Past Surgical History:  Procedure Laterality Date   TONSILLECTOMY     WRIST FRACTURE SURGERY Left    Past Medical History:  Diagnosis Date   Anemia    Asthma    GERD (gastroesophageal reflux disease)    High cholesterol    Migraines    BP 101/70 (BP Location: Left Arm, Patient Position: Sitting, Cuff Size: Large)   Pulse (!) 101   Ht 5\' 4"  (1.626 m)   Wt 243 lb 12.8 oz (110.6 kg)   LMP 02/01/2024 (Exact Date)   SpO2 96%   BMI 41.85 kg/m   Opioid Risk Score:   Fall Risk Score:  `1  Depression screen [facility] 2/9     03/01/2024    9:57 AM  Depression screen PHQ 2/9  Decreased Interest 2  Down, Depressed, Hopeless 0  PHQ - 2 Score 2  Altered sleeping 3  Tired, decreased energy 2  Change in appetite 0  Feeling bad or failure about yourself  1  Trouble concentrating 2  Moving slowly or fidgety/restless 0  Suicidal thoughts 0  PHQ-9 Score 10  Difficult doing work/chores Somewhat difficult      Review of Systems  Constitutional:   Positive for unexpected weight change.  Gastrointestinal:  Positive for constipation.  Endocrine:       Night sweats  Musculoskeletal:  Positive for arthralgias, back pain, gait problem, joint swelling and myalgias.       Back pain, bilateral shoulder pain radiates down arm right hip pain radiating down leg, bilateral foot pain, bilateral hand pain  Neurological:  Positive for weakness.  All other systems reviewed and are negative.      Objective:   Physical Exam   General: Alert and oriented x 3, No apparent  distress, obese HEENT: Head is normocephalic, atraumatic, PERRLA, EOMI, sclera anicteric, oral mucosa pink and moist, dentition intact, ext ear canals clear,  Neck: Supple without JVD or lymphadenopathy Heart: Reg rate and rhythm. No murmurs rubs or gallops Chest: CTA bilaterally without wheezes, rales, or rhonchi; no distress Abdomen: Soft, non-tender, non-distended, bowel sounds positive. Extremities: No clubbing, cyanosis, or edema. Pulses are 2+ Psych: Pt's affect is appropriate. Pt is cooperative Skin: Clean and intact without signs of breakdown Neuro:  Alert and oriented x 3. Normal insight and awareness. Intact Memory. Normal language and speech. Cranial nerve exam unremarkable. MMT: 5/5 in all 4's. Sensory exam normal for light touch and pain in all 4 limbs. No limb ataxia or cerebellar signs. No abnormal tone appreciated.  .   Musculoskeletal: Patient with multiple tender areas from occiput to the trapezius muscles, bilateral shoulders and elbows, wrist and hands, cervical and lumbar spine, greater trochanter areas, PSIS areas, medial and lateral knees, bilateral ankles and feet.  Patient with discomfort with lumbar extension more than flexion today.  Facet maneuvers provoke pain in her low back.  She ranged her neck surprisingly with little discomfort although she was a bit tight in forward flexion.  Did not palpate any focal trigger points today in her trapezius muscles or  shoulder girdle in general.  Gait slightly wide-based.  She uses a cane for balance.  No joint deformities were appreciated in the hands or wrists.      Assessment & Plan:  Chronic pain syndrome/fibromyalgia Chronic widespread pain Sleep disorder Brain fog Depression Fatigue Migraine headaches. Myofascial pain  2. Cervical and lumbar DDD with spondylosis 3. Hx of + ANA 4. Hx of migraines prior to her current pain syndrome 5. Obesity     Plan: Low impact exercises, pool, stationary bike Trial of therapy at [facility] might be helpful for her given their holistic and alternative approach.  However, she just finished up a course of rehab at [facility] and is working on some of the exercises recommended they are which helped her..  Increase lyrica to 75mg  tid.  A new prescription was provided. Dc robaxin  and begin trial of flexeril 5mg  at bedtime and daily prn.  This nightly may help with muscle related pain but also could potentially help with her sleep.  She can continue on trazodone for now.  I am not sure that falling asleep is the issue as much as the issue is pain waking her up from sleep. Follow up with rheumatology as directed Pt wants to avoid narcotic rx.  7.   Weight loss is a key, especially for her lower body joint pain. She has lost 14lbs since beginning zepbound 8.   Supps and foods were presented which can help with joint pain.   Sixty minutes of face to face patient care time were spent during this visit. All questions were encouraged and answered. Follow up with me in 2 mos. it was a pleasure to meet Sofhia today!

## 2024-03-02 DIAGNOSIS — Z01419 Encounter for gynecological examination (general) (routine) without abnormal findings: Secondary | ICD-10-CM | POA: Diagnosis not present

## 2024-03-02 DIAGNOSIS — Z808 Family history of malignant neoplasm of other organs or systems: Secondary | ICD-10-CM | POA: Diagnosis not present

## 2024-03-02 DIAGNOSIS — Z8042 Family history of malignant neoplasm of prostate: Secondary | ICD-10-CM | POA: Diagnosis not present

## 2024-03-02 DIAGNOSIS — N920 Excessive and frequent menstruation with regular cycle: Secondary | ICD-10-CM | POA: Diagnosis not present

## 2024-03-02 DIAGNOSIS — Z8049 Family history of malignant neoplasm of other genital organs: Secondary | ICD-10-CM | POA: Diagnosis not present

## 2024-03-02 DIAGNOSIS — Z1151 Encounter for screening for human papillomavirus (HPV): Secondary | ICD-10-CM | POA: Diagnosis not present

## 2024-03-05 ENCOUNTER — Encounter: Payer: Self-pay | Admitting: Internal Medicine

## 2024-03-10 ENCOUNTER — Encounter: Payer: Self-pay | Admitting: Internal Medicine

## 2024-03-10 ENCOUNTER — Ambulatory Visit: Admitting: Internal Medicine

## 2024-03-10 VITALS — BP 123/64 | HR 72 | Temp 99.0°F | Resp 11 | Ht 64.0 in | Wt 250.0 lb

## 2024-03-10 DIAGNOSIS — Z1211 Encounter for screening for malignant neoplasm of colon: Secondary | ICD-10-CM

## 2024-03-10 DIAGNOSIS — R1013 Epigastric pain: Secondary | ICD-10-CM

## 2024-03-10 DIAGNOSIS — K3189 Other diseases of stomach and duodenum: Secondary | ICD-10-CM

## 2024-03-10 DIAGNOSIS — K5730 Diverticulosis of large intestine without perforation or abscess without bleeding: Secondary | ICD-10-CM

## 2024-03-10 DIAGNOSIS — K219 Gastro-esophageal reflux disease without esophagitis: Secondary | ICD-10-CM

## 2024-03-10 DIAGNOSIS — D509 Iron deficiency anemia, unspecified: Secondary | ICD-10-CM

## 2024-03-10 DIAGNOSIS — K319 Disease of stomach and duodenum, unspecified: Secondary | ICD-10-CM | POA: Diagnosis not present

## 2024-03-10 DIAGNOSIS — K625 Hemorrhage of anus and rectum: Secondary | ICD-10-CM

## 2024-03-10 DIAGNOSIS — K648 Other hemorrhoids: Secondary | ICD-10-CM | POA: Diagnosis not present

## 2024-03-10 MED ORDER — SODIUM CHLORIDE 0.9 % IV SOLN: 500.0000 mL | Freq: Once | INTRAVENOUS | Status: DC

## 2024-03-10 NOTE — Progress Notes (Unsigned)
 Report given to PACU, vss

## 2024-03-10 NOTE — Op Note (Signed)
 [facility] Patient Name: Madison Waters Procedure Date: 03/10/2024 2:46 PM MRN: [id number] Endoscopist: Nannette Babe , MD, [id number] Age: 43 Referring MD:  Date of Birth: Jan 25, 1981 Gender: Female Account #: [id number] Procedure:                Colonoscopy Indications:              Rectal bleeding, Iron deficiency anemia, no                            previous colonoscopy Medicines:                Monitored Anesthesia Care Procedure:                Pre-Anesthesia Assessment:                           - Prior to the procedure, a History and Physical                            was performed, and patient medications and                            allergies were reviewed. The patient's tolerance of                            previous anesthesia was also reviewed. The risks                            and benefits of the procedure and the sedation                            options and risks were discussed with the patient.                            All questions were answered, and informed consent                            was obtained. Prior Anticoagulants: The patient has                            taken no anticoagulant or antiplatelet agents. ASA                            Grade Assessment: III - A patient with severe                            systemic disease. After reviewing the risks and                            benefits, the patient was deemed in satisfactory                            condition to undergo the procedure.  After obtaining informed consent, the colonoscope                            was passed under direct vision. Throughout the                            procedure, the patient's blood pressure, pulse, and                            oxygen saturations were monitored continuously. The                            Olympus Scope SN: [id number] was introduced through                            the anus and advanced to the cecum,  identified by                            appendiceal orifice and ileocecal valve. The                            colonoscopy was performed without difficulty. The                            patient tolerated the procedure well. The quality                            of the bowel preparation was good. The ileocecal                            valve, appendiceal orifice, and rectum were                            photographed. Scope In: 3:14:25 PM Scope Out: 3:24:05 PM Scope Withdrawal Time: 0 hours 7 minutes 46 seconds  Total Procedure Duration: 0 hours 9 minutes 40 seconds  Findings:                 The digital rectal exam was normal.                           A few small-mouthed diverticula were found in the                            sigmoid colon and descending colon.                           Internal hemorrhoids were found during                            retroflexion. The hemorrhoids were small.                           The exam was otherwise without abnormality. Complications:            No immediate  complications. Estimated Blood Loss:     Estimated blood loss: none. Impression:               - Mild diverticulosis in the sigmoid colon and in                            the descending colon.                           - Internal hemorrhoids.                           - The examination was otherwise normal.                           - No specimens collected. Recommendation:           - Patient has a contact number available for                            emergencies. The signs and symptoms of potential                            delayed complications were discussed with the                            patient. Return to normal activities tomorrow.                            Written discharge instructions were provided to the                            patient.                           - Resume previous diet.                           - Continue present medications.                            - Continue to monitor Hgb and iron studies. If                            persistent iron deficiency then video capsule study                            is recommended.                           - If painless rectal bleeding continues then                            hemorrhoidal banding office visits can be scheduled.                           - Repeat colonoscopy in 10 years for screening  purposes. Nannette Babe, MD 03/10/2024 3:33:46 PM This report has been signed electronically.

## 2024-03-10 NOTE — Progress Notes (Unsigned)
 GASTROENTEROLOGY PROCEDURE H&P NOTE   Primary Care Physician: Yvonnie Heritage, NP    Reason for Procedure:  Epigastric pain, GERD, bloating, history of PUD, iron deficiency anemia, screening  Plan:    Upper and lower endoscopy  Patient is appropriate for endoscopic procedure(s) in the ambulatory ([facility]) setting.  The nature of the procedure, as well as the risks, benefits, and alternatives were carefully and thoroughly reviewed with the patient. Ample time for discussion and questions allowed. The patient understood, was satisfied, and agreed to proceed.     HPI: Madison Waters is a 43 y.o. female who presents for EGD colonoscopy.  Medical history as below.  Tolerated the prep.  No recent chest pain or shortness of breath.  No abdominal pain today.  Past Medical History:  Diagnosis Date   Anemia    Asthma    GERD (gastroesophageal reflux disease)    High cholesterol    Migraines    Morbid (severe) obesity due to excess calories (HCC)    bmi 41.85    Past Surgical History:  Procedure Laterality Date   TONSILLECTOMY     WRIST FRACTURE SURGERY Left     Prior to Admission medications   Medication Sig Start Date End Date Taking? Authorizing Provider  ADVAIR DISKUS 250-50 MCG/ACT AEPB Inhale 1 puff into the lungs 2 (two) times daily. 06/03/22  Yes [provider]  albuterol (VENTOLIN HFA) 108 (90 Base) MCG/ACT inhaler Inhale 2 puffs into the lungs every 6 (six) hours as needed. 05/30/22  Yes [provider]  cyclobenzaprine  (FLEXERIL ) 5 MG tablet Take 1 tablet (5 mg total) by mouth 2 (two) times daily as needed for muscle spasms. Take one at bedtime and daily as needed for spasms 03/01/24  Yes Rawland Caddy, MD  DULoxetine (CYMBALTA) 30 MG capsule Take 90 mg by mouth daily. 01/03/24  Yes [provider]  pantoprazole (PROTONIX) 40 MG tablet Take 40 mg by mouth 2 (two) times daily. 05/18/22  Yes [provider]  pregabalin  (LYRICA ) 75 MG capsule Take  1 capsule (75 mg total) by mouth 3 (three) times daily. 03/01/24  Yes Rawland Caddy, MD  Rimegepant Sulfate (NURTEC) 75 MG TBDP Take 1 tablet by mouth daily as needed. 06/29/22  Yes [provider]  traZODone (DESYREL) 100 MG tablet Take 100 mg by mouth at bedtime. 05/28/22  Yes [provider]  Erenumab -aooe (AIMOVIG ) 140 MG/ML SOAJ Inject 140 mg into the skin every 28 (twenty-eight) days. 04/28/23   Merriam Abbey, DO  promethazine  (PHENERGAN ) 25 MG tablet Take 1 tablet (25 mg total) by mouth every 6 (six) hours as needed for nausea or vomiting. 11/16/23   Festus Hubert, Adam R, DO  sucralfate (CARAFATE) 1 g tablet Take 1 g by mouth 2 (two) times daily. Patient not taking: Reported on 03/10/2024 06/26/22   [provider]  ZEPBOUND 2.5 MG/0.5ML Pen Inject 2.5 mg into the skin once a week. 01/14/24   [provider]    Current Outpatient Medications  Medication Sig Dispense Refill   ADVAIR DISKUS 250-50 MCG/ACT AEPB Inhale 1 puff into the lungs 2 (two) times daily.     albuterol (VENTOLIN HFA) 108 (90 Base) MCG/ACT inhaler Inhale 2 puffs into the lungs every 6 (six) hours as needed.     cyclobenzaprine  (FLEXERIL ) 5 MG tablet Take 1 tablet (5 mg total) by mouth 2 (two) times daily as needed for muscle spasms. Take one at bedtime and daily as needed for spasms  60 tablet 0   DULoxetine (CYMBALTA) 30 MG capsule Take 90 mg by mouth daily.     pantoprazole (PROTONIX) 40 MG tablet Take 40 mg by mouth 2 (two) times daily.     pregabalin  (LYRICA ) 75 MG capsule Take 1 capsule (75 mg total) by mouth 3 (three) times daily. 90 capsule 3   Rimegepant Sulfate (NURTEC) 75 MG TBDP Take 1 tablet by mouth daily as needed.     traZODone (DESYREL) 100 MG tablet Take 100 mg by mouth at bedtime.     Erenumab -aooe (AIMOVIG ) 140 MG/ML SOAJ Inject 140 mg into the skin every 28 (twenty-eight) days. 1.12 mL 11   promethazine  (PHENERGAN ) 25 MG tablet Take 1 tablet (25 mg total) by mouth every 6 (six)  hours as needed for nausea or vomiting. 30 tablet 5   sucralfate (CARAFATE) 1 g tablet Take 1 g by mouth 2 (two) times daily. (Patient not taking: Reported on 03/10/2024)     ZEPBOUND 2.5 MG/0.5ML Pen Inject 2.5 mg into the skin once a week.     Current Facility-Administered Medications  Medication Dose Route Frequency Provider Last Rate Last Admin   0.9 %  sodium chloride  infusion  500 mL Intravenous Once Imari Sivertsen, Amber Bail, MD        Allergies as of 03/10/2024 - Review Complete 03/10/2024  Allergen Reaction Noted   Sulfa antibiotics Hives and Rash 04/11/2021    Family History  Problem Relation Age of Onset   Stroke Mother    Heart disease Maternal Grandmother    Aneurysm Maternal Grandfather    Ovarian cancer Paternal Grandmother    Prostate cancer Paternal Grandfather     Social History   Socioeconomic History   Marital status: Married    Spouse name: Not on file   Number of children: Not on file   Years of education: Not on file   Highest education level: Not on file  Occupational History   Not on file  Tobacco Use   Smoking status: Never   Smokeless tobacco: Never  Vaping Use   Vaping status: Never Used  Substance and Sexual Activity   Alcohol use: Not Currently   Drug use: Never   Sexual activity: Not on file  Other Topics Concern   Not on file  Social History Narrative   Right handed   Social Drivers of Health   Financial Resource Strain: Not on file  Food Insecurity: Not on file  Transportation Needs: Not on file  Physical Activity: Not on file  Stress: Not on file  Social Connections: Unknown (01/28/2022)   Received from [facility]   Social Network    Social Network: Not on file  Intimate Partner Violence: Unknown (01/02/2022)   Received from [facility]   HITS    Physically Hurt: Not on file    Insult or Talk Down To: Not on file    Threaten Physical Harm: Not on file    Scream or Curse: Not on file    Physical Exam: Vital signs in last 24  hours: @BP  122/72   Pulse 87   Temp 99 F (37.2 C)   Ht 5' 4 (1.626 m)   Wt 250 lb (113.4 kg)   SpO2 98%   BMI 42.91 kg/m  GEN: NAD EYE: Sclerae anicteric ENT: MMM CV: Non-tachycardic Pulm: CTA b/l GI: Soft, NT/ND NEURO:  Alert & Oriented x 3   Laurell Pond, MD [facility]  03/10/2024 2:52 PM

## 2024-03-10 NOTE — Progress Notes (Unsigned)
1502 Robinul 0.1 mg IV given due large amount of secretions upon assessment.  MD made aware, vss

## 2024-03-10 NOTE — Progress Notes (Unsigned)
 Called to room to assist during endoscopic procedure.  Patient ID and intended procedure confirmed with present staff. Received instructions for my participation in the procedure from the performing physician.

## 2024-03-10 NOTE — Patient Instructions (Signed)
 YOU HAD AN ENDOSCOPIC PROCEDURE TODAY AT THE [facility]:   Refer to the procedure report that was given to you for any specific questions about what was found during the examination.  If the procedure report does not answer your questions, please call your gastroenterologist to clarify.  If you requested that your care partner not be given the details of your procedure findings, then the procedure report has been included in a sealed envelope for you to review at your convenience later.  **handouts given on hemorrhoids and diverticulosis**  YOU SHOULD EXPECT: Some feelings of bloating in the abdomen. Passage of more gas than usual.  Walking can help get rid of the air that was put into your GI tract during the procedure and reduce the bloating. If you had a lower endoscopy (such as a colonoscopy or flexible sigmoidoscopy) you may notice spotting of blood in your stool or on the toilet paper. If you underwent a bowel prep for your procedure, you may not have a normal bowel movement for a few days.  Please Note:  You might notice some irritation and congestion in your nose or some drainage.  This is from the oxygen used during your procedure.  There is no need for concern and it should clear up in a day or so.  SYMPTOMS TO REPORT IMMEDIATELY:  Following lower endoscopy (colonoscopy or flexible sigmoidoscopy):  Excessive amounts of blood in the stool  Significant tenderness or worsening of abdominal pains  Swelling of the abdomen that is new, acute  Fever of 100F or higher  Following upper endoscopy (EGD)  Vomiting of blood or coffee ground material  New chest pain or pain under the shoulder blades  Painful or persistently difficult swallowing  New shortness of breath  Fever of 100F or higher  Black, tarry-looking stools  For urgent or emergent issues, a gastroenterologist can be reached at any hour by calling [phone]. Do not use MyChart messaging for urgent concerns.     DIET:  We do recommend a small meal at first, but then you may proceed to your regular diet.  Drink plenty of fluids but you should avoid alcoholic beverages for 24 hours.  ACTIVITY:  You should plan to take it easy for the rest of today and you should NOT DRIVE or use heavy machinery until tomorrow (because of the sedation medicines used during the test).    FOLLOW UP: Our staff will call the number listed on your records the next business day following your procedure.  We will call around 7:15- 8:00 am to check on you and address any questions or concerns that you may have regarding the information given to you following your procedure. If we do not reach you, we will leave a message.     If any biopsies were taken you will be contacted by phone or by letter within the next 1-3 weeks.  Please call us  at [phone] if you have not heard about the biopsies in 3 weeks.    SIGNATURES/CONFIDENTIALITY: You and/or your care partner have signed paperwork which will be entered into your electronic medical record.  These signatures attest to the fact that that the information above on your After Visit Summary has been reviewed and is understood.  Full responsibility of the confidentiality of this discharge information lies with you and/or your care-partner.

## 2024-03-10 NOTE — Progress Notes (Signed)
 Pt's states no medical or surgical changes since previsit or office visit.

## 2024-03-10 NOTE — Op Note (Signed)
 [facility] Patient Name: Madison Waters Procedure Date: 03/10/2024 2:46 PM MRN: [id number] Endoscopist: Nannette Babe , MD, [id number] Age: 43 Referring MD:  Date of Birth: 11/01/1980 Gender: Female Account #: [id number] Procedure:                Upper GI endoscopy Indications:              Epigastric abdominal pain, Iron deficiency anemia,                            Gastro-esophageal reflux disease Medicines:                Monitored Anesthesia Care Procedure:                Pre-Anesthesia Assessment:                           - Prior to the procedure, a History and Physical                            was performed, and patient medications and                            allergies were reviewed. The patient's tolerance of                            previous anesthesia was also reviewed. The risks                            and benefits of the procedure and the sedation                            options and risks were discussed with the patient.                            All questions were answered, and informed consent                            was obtained. Prior Anticoagulants: The patient has                            taken no anticoagulant or antiplatelet agents. ASA                            Grade Assessment: III - A patient with severe                            systemic disease. After reviewing the risks and                            benefits, the patient was deemed in satisfactory                            condition to undergo the procedure.  After obtaining informed consent, the endoscope was                            passed under direct vision. Throughout the                            procedure, the patient's blood pressure, pulse, and                            oxygen saturations were monitored continuously. The                            GIF [id number] #[id number] was introduced through the                            mouth, and advanced to  the second part of duodenum.                            The upper GI endoscopy was accomplished without                            difficulty. The patient tolerated the procedure                            well. Scope In: Scope Out: Findings:                 The examined esophagus was normal.                           The entire examined stomach was normal. Biopsies                            were taken with a cold forceps for Helicobacter                            pylori testing.                           The examined duodenum was normal. Biopsies for                            histology were taken with a cold forceps for                            evaluation of celiac disease. Complications:            No immediate complications. Estimated Blood Loss:     Estimated blood loss: none. Impression:               - Normal esophagus.                           - Normal stomach. Biopsied.                           - Normal examined duodenum. Biopsied.  Recommendation:           - Patient has a contact number available for                            emergencies. The signs and symptoms of potential                            delayed complications were discussed with the                            patient. Return to normal activities tomorrow.                            Written discharge instructions were provided to the                            patient.                           - Resume previous diet.                           - Continue present medications.                           - Await pathology results.                           - Continue to monitor Hgb, iron studies going                            forward.                           - See the other procedure note for documentation of                            additional recommendations. Nannette Babe, MD 03/10/2024 3:29:29 PM This report has been signed electronically.

## 2024-03-13 ENCOUNTER — Telehealth: Payer: Self-pay

## 2024-03-13 NOTE — Telephone Encounter (Signed)
  Follow up Call-     03/10/2024    2:48 PM  Call back number  Post procedure Call Back phone  # [phone]  Permission to leave phone message Yes     Patient questions:  Do you have a fever, pain , or abdominal swelling? No. Pain Score  0 *  Have you tolerated food without any problems? Yes.    Have you been able to return to your normal activities? Yes.    Do you have any questions about your discharge instructions: Diet   No. Medications  No. Follow up visit  No.  Do you have questions or concerns about your Care? No.  Actions: * If pain score is 4 or above: No action needed, pain <4.

## 2024-03-14 ENCOUNTER — Telehealth: Payer: Self-pay

## 2024-03-14 DIAGNOSIS — M7918 Myalgia, other site: Secondary | ICD-10-CM

## 2024-03-14 DIAGNOSIS — M7061 Trochanteric bursitis, right hip: Secondary | ICD-10-CM | POA: Diagnosis not present

## 2024-03-14 DIAGNOSIS — G479 Sleep disorder, unspecified: Secondary | ICD-10-CM

## 2024-03-14 DIAGNOSIS — M797 Fibromyalgia: Secondary | ICD-10-CM

## 2024-03-14 DIAGNOSIS — G43E09 Chronic migraine with aura, not intractable, without status migrainosus: Secondary | ICD-10-CM

## 2024-03-14 DIAGNOSIS — M25551 Pain in right hip: Secondary | ICD-10-CM | POA: Diagnosis not present

## 2024-03-14 MED ORDER — PREGABALIN 75 MG PO CAPS: 75.0000 mg | ORAL_CAPSULE | Freq: Three times a day (TID) | ORAL | 3 refills | Status: DC

## 2024-03-14 NOTE — Telephone Encounter (Signed)
 Pt needed a refill on Pregablin,pharmacy wouldn't refill due to it not being time. Now that the refill is needed, pt stated that the pharmacy is giving her a hard time. Please advise.

## 2024-03-14 NOTE — Telephone Encounter (Signed)
 I sent in a new refill for pregabalin . Hopefully she'll be set now. thx

## 2024-03-16 LAB — SURGICAL PATHOLOGY

## 2024-03-17 ENCOUNTER — Ambulatory Visit: Payer: Self-pay | Admitting: Internal Medicine

## 2024-03-17 DIAGNOSIS — M79671 Pain in right foot: Secondary | ICD-10-CM | POA: Diagnosis not present

## 2024-03-17 DIAGNOSIS — M25572 Pain in left ankle and joints of left foot: Secondary | ICD-10-CM | POA: Diagnosis not present

## 2024-03-17 DIAGNOSIS — M79672 Pain in left foot: Secondary | ICD-10-CM | POA: Diagnosis not present

## 2024-03-17 DIAGNOSIS — M25571 Pain in right ankle and joints of right foot: Secondary | ICD-10-CM | POA: Diagnosis not present

## 2024-03-21 DIAGNOSIS — M546 Pain in thoracic spine: Secondary | ICD-10-CM | POA: Diagnosis not present

## 2024-03-21 DIAGNOSIS — M9902 Segmental and somatic dysfunction of thoracic region: Secondary | ICD-10-CM | POA: Diagnosis not present

## 2024-03-21 DIAGNOSIS — M25512 Pain in left shoulder: Secondary | ICD-10-CM | POA: Diagnosis not present

## 2024-03-21 DIAGNOSIS — M9908 Segmental and somatic dysfunction of rib cage: Secondary | ICD-10-CM | POA: Diagnosis not present

## 2024-03-21 DIAGNOSIS — M542 Cervicalgia: Secondary | ICD-10-CM | POA: Diagnosis not present

## 2024-03-21 DIAGNOSIS — M25511 Pain in right shoulder: Secondary | ICD-10-CM | POA: Diagnosis not present

## 2024-03-21 DIAGNOSIS — M9907 Segmental and somatic dysfunction of upper extremity: Secondary | ICD-10-CM | POA: Diagnosis not present

## 2024-03-21 DIAGNOSIS — M9901 Segmental and somatic dysfunction of cervical region: Secondary | ICD-10-CM | POA: Diagnosis not present

## 2024-03-28 ENCOUNTER — Other Ambulatory Visit: Payer: Self-pay | Admitting: Physical Medicine & Rehabilitation

## 2024-03-28 DIAGNOSIS — G479 Sleep disorder, unspecified: Secondary | ICD-10-CM

## 2024-03-28 DIAGNOSIS — M7918 Myalgia, other site: Secondary | ICD-10-CM

## 2024-03-28 DIAGNOSIS — G43E09 Chronic migraine with aura, not intractable, without status migrainosus: Secondary | ICD-10-CM

## 2024-03-28 DIAGNOSIS — M797 Fibromyalgia: Secondary | ICD-10-CM

## 2024-03-29 DIAGNOSIS — M76822 Posterior tibial tendinitis, left leg: Secondary | ICD-10-CM | POA: Diagnosis not present

## 2024-03-29 DIAGNOSIS — M76821 Posterior tibial tendinitis, right leg: Secondary | ICD-10-CM | POA: Diagnosis not present

## 2024-04-04 DIAGNOSIS — N924 Excessive bleeding in the premenopausal period: Secondary | ICD-10-CM | POA: Diagnosis not present

## 2024-04-05 DIAGNOSIS — M76821 Posterior tibial tendinitis, right leg: Secondary | ICD-10-CM | POA: Diagnosis not present

## 2024-04-05 DIAGNOSIS — M76822 Posterior tibial tendinitis, left leg: Secondary | ICD-10-CM | POA: Diagnosis not present

## 2024-04-07 DIAGNOSIS — M76822 Posterior tibial tendinitis, left leg: Secondary | ICD-10-CM | POA: Diagnosis not present

## 2024-04-07 DIAGNOSIS — M76821 Posterior tibial tendinitis, right leg: Secondary | ICD-10-CM | POA: Diagnosis not present

## 2024-04-11 ENCOUNTER — Telehealth: Admitting: Physician Assistant

## 2024-04-11 DIAGNOSIS — J069 Acute upper respiratory infection, unspecified: Secondary | ICD-10-CM | POA: Diagnosis not present

## 2024-04-11 DIAGNOSIS — B9789 Other viral agents as the cause of diseases classified elsewhere: Secondary | ICD-10-CM

## 2024-04-11 MED ORDER — PREDNISONE 20 MG PO TABS: 40.0000 mg | ORAL_TABLET | Freq: Every day | ORAL | 0 refills | Status: DC

## 2024-04-11 MED ORDER — IPRATROPIUM BROMIDE 0.03 % NA SOLN: 2.0000 | Freq: Two times a day (BID) | NASAL | 0 refills | Status: AC

## 2024-04-11 MED ORDER — AMOXICILLIN-POT CLAVULANATE 875-125 MG PO TABS: 1.0000 | ORAL_TABLET | Freq: Two times a day (BID) | ORAL | 0 refills | Status: AC

## 2024-04-11 NOTE — Progress Notes (Signed)
 I have spent 5 minutes in review of e-visit questionnaire, review and updating patient chart, medical decision making and response to patient.   Piedad Climes, PA-C

## 2024-04-11 NOTE — Progress Notes (Signed)
 Madison Waters, Madison Waters are scheduled for a virtual visit with your provider today.    Just as we do with appointments in the office, we must obtain your consent to participate.  Your consent will be active for this visit and any virtual visit you may have with one of our providers in the next 365 days.    If you have a MyChart account, I can also send a copy of this consent to you electronically.  All virtual visits are billed to your insurance company just like a traditional visit in the office.  As this is a virtual visit, video technology does not allow for your provider to perform a traditional examination.  This may limit your provider's ability to fully assess your condition.  If your provider identifies any concerns that need to be evaluated in person or the need to arrange testing such as labs, EKG, etc, we will make arrangements to do so.    Although advances in technology are sophisticated, we cannot ensure that it will always work on either your end or our end.  If the connection with a video visit is poor, we may have to switch to a telephone visit.  With either a video or telephone visit, we are not always able to ensure that we have a secure connection.   I need to obtain your verbal consent now.   Are you willing to proceed with your visit today?   Madison Waters has provided verbal consent on 04/11/2024 for a virtual visit (video or telephone).   Madison GORMAN Snuffer, Madison Waters 04/11/2024  3:45 PM   Date:  04/11/2024   ID:  Madison Waters, DOB [birth year], MRN [id number]  Patient Location: Home Provider Location: Home Office   Participants: Patient and Provider for Visit and Wrap up  Method of visit: Video  Location of Patient: Home Location of Provider: Home Office Consent was obtain for visit over the video. Services rendered by provider: Visit was performed via video  A video enabled telemedicine application was used and I verified that I am speaking with the correct person using two  identifiers.  PCP:  Corlis Pagan, NP   Chief Complaint:  duana sxs  History of Present Illness:    Madison Waters is a 43 y.o. female with history as stated below. Presents video telehealth for an acute care visit  Pt states that she has had lymphadenopathy for the last week. She also reports a foul smell/taste to the back of her throat. Denies temps >100.35F  She reports congestion that has been ongoing for the last 6 days. Denies rhinorrhea. Reports minimal cough. Has been taking guaifenesin today. Denies vomiting, diarrhea.    Past Medical, Surgical, Social History, Allergies, and Medications have been Reviewed.  Past Medical History:  Diagnosis Date   Anemia    Asthma    GERD (gastroesophageal reflux disease)    High cholesterol    Migraines    Morbid (severe) obesity due to excess calories (HCC)    bmi 41.85    Current Meds  Medication Sig   [START ON 04/16/2024] amoxicillin -clavulanate (AUGMENTIN ) 875-125 MG tablet Take 1 tablet by mouth 2 (two) times daily for 7 days.     Allergies:   Sulfa antibiotics   ROS See HPI for history of present illness.  Physical Exam Constitutional:      Appearance: Normal appearance. She is not ill-appearing.  Neurological:     Mental Status: She is alert.  MDM: Pt with congestion for 6 days with lymphadenopathy. No fevers. Suspect viral sinusitis. Discussed indications for abx treatment. Advised continued supportive care and watch/wait. If sxs persist past 10 days or she develops fevers abx were sent     Tests Ordered: No orders of the defined types were placed in this encounter.   Medication Changes: Meds ordered this encounter  Medications   amoxicillin -clavulanate (AUGMENTIN ) 875-125 MG tablet    Sig: Take 1 tablet by mouth 2 (two) times daily for 7 days.    Dispense:  14 tablet    Refill:  0     Disposition:  Follow up  Signed, Madison GORMAN Snuffer, Madison Waters  04/11/2024 3:45 PM

## 2024-04-11 NOTE — Patient Instructions (Signed)
 Sharanya F Henthorn, thank you for joining Lynden GORMAN Snuffer, PA-C for today's virtual visit.  While this provider is not your primary care provider (PCP), if your PCP is located in our provider database this encounter information will be shared with them immediately following your visit.   A [facility] MyChart account gives you access to today's visit and all your visits, tests, and labs performed at [facility]  click here if you don't have a [facility] MyChart account or go to [email or web address]  Consent: (Patient) Madison Waters provided verbal consent for this virtual visit at the beginning of the encounter.  Current Medications:  Current Outpatient Medications:    ADVAIR DISKUS 250-50 MCG/ACT AEPB, Inhale 1 puff into the lungs 2 (two) times daily., Disp: , Rfl:    albuterol (VENTOLIN HFA) 108 (90 Base) MCG/ACT inhaler, Inhale 2 puffs into the lungs every 6 (six) hours as needed., Disp: , Rfl:    cyclobenzaprine  (FLEXERIL ) 5 MG tablet, TAKE 1 TABLET (5 MG TOTAL) BY MOUTH 2 (TWO) TIMES DAILY AS NEEDED FOR MUSCLE SPASMS. TAKE ONE AT BEDTIME AND DAILY AS NEEDED FOR SPASMS, Disp: 60 tablet, Rfl: 0   DULoxetine (CYMBALTA) 30 MG capsule, Take 90 mg by mouth daily., Disp: , Rfl:    Erenumab -aooe (AIMOVIG ) 140 MG/ML SOAJ, Inject 140 mg into the skin every 28 (twenty-eight) days., Disp: 1.12 mL, Rfl: 11   Ferric Maltol (ACCRUFER) 30 MG CAPS, Take 1 capsule every day by oral route., Disp: , Rfl:    ipratropium (ATROVENT ) 0.03 % nasal spray, Place 2 sprays into both nostrils every 12 (twelve) hours., Disp: 30 mL, Rfl: 0   pantoprazole (PROTONIX) 40 MG tablet, Take 40 mg by mouth 2 (two) times daily., Disp: , Rfl:    predniSONE  (DELTASONE ) 20 MG tablet, Take 2 tablets (40 mg total) by mouth daily with breakfast., Disp: 10 tablet, Rfl: 0   pregabalin  (LYRICA ) 75 MG capsule, Take 1 capsule (75 mg total) by mouth 3 (three) times daily., Disp: 90 capsule, Rfl: 3   promethazine  (PHENERGAN ) 25  MG tablet, Take 1 tablet (25 mg total) by mouth every 6 (six) hours as needed for nausea or vomiting., Disp: 30 tablet, Rfl: 5   Rimegepant Sulfate (NURTEC) 75 MG TBDP, Take 1 tablet by mouth daily as needed., Disp: , Rfl:    sucralfate (CARAFATE) 1 g tablet, Take 1 g by mouth 2 (two) times daily. (Patient not taking: Reported on 03/10/2024), Disp: , Rfl:    traZODone (DESYREL) 100 MG tablet, Take 100 mg by mouth at bedtime., Disp: , Rfl:    ZEPBOUND 2.5 MG/0.5ML Pen, Inject 2.5 mg into the skin once a week., Disp: , Rfl:    Medications ordered in this encounter:  No orders of the defined types were placed in this encounter.    *If you need refills on other medications prior to your next appointment, please contact your pharmacy*  Follow-Up: Call back or seek an in-person evaluation if the symptoms worsen or if the condition fails to improve as anticipated.   Virtual Care [phone]  Other Instructions Use the steroid nasal spray and the steroids that were sent earlier today   You symptoms are most likely viral in nature and we recommend treating your symptoms supportively. With that being said, an antibiotic was sent should your symptoms persist or worsen. This should not be filled unless your symptoms persist past 10 days or you develop fevers >100.72F.   Follow up with your  regular doctor in 1 week for reassessment and seek care sooner if your symptoms worsen or fail to improve.   If you have been instructed to have an in-person evaluation today at a local Urgent Care facility, please use the link below. It will take you to a list of all of our available [facility], including address, phone number and hours of operation. Please do not delay care.  [facility]  If you or a family member do not have a primary care provider, use the link below to schedule a visit and establish care. When you choose a [facility] primary care physician or advanced  practice provider, you gain a long-term partner in health. Find a Primary Care Provider  Learn more about [facility] in-office and virtual care options: [facility] - Get Care Now

## 2024-04-11 NOTE — Progress Notes (Signed)

## 2024-04-17 DIAGNOSIS — M76822 Posterior tibial tendinitis, left leg: Secondary | ICD-10-CM | POA: Diagnosis not present

## 2024-04-17 DIAGNOSIS — M76821 Posterior tibial tendinitis, right leg: Secondary | ICD-10-CM | POA: Diagnosis not present

## 2024-04-20 ENCOUNTER — Telehealth: Payer: Self-pay

## 2024-04-20 NOTE — Telephone Encounter (Signed)
 Referral note received from [facility], Our physician des not treat the diagnosis indicated ion the referral.

## 2024-04-21 DIAGNOSIS — M76822 Posterior tibial tendinitis, left leg: Secondary | ICD-10-CM | POA: Diagnosis not present

## 2024-04-21 DIAGNOSIS — M76821 Posterior tibial tendinitis, right leg: Secondary | ICD-10-CM | POA: Diagnosis not present

## 2024-04-21 NOTE — Telephone Encounter (Signed)
 Patient advised per Dr.Jaffe note, OK, she will have to follow up with Dr. Joshua or request a referral for second opinion from him    Patient verbalized understanding.

## 2024-04-24 DIAGNOSIS — M76821 Posterior tibial tendinitis, right leg: Secondary | ICD-10-CM | POA: Diagnosis not present

## 2024-04-24 DIAGNOSIS — M76822 Posterior tibial tendinitis, left leg: Secondary | ICD-10-CM | POA: Diagnosis not present

## 2024-04-26 ENCOUNTER — Encounter: Payer: Self-pay | Admitting: Neurology

## 2024-04-26 DIAGNOSIS — M9907 Segmental and somatic dysfunction of upper extremity: Secondary | ICD-10-CM | POA: Diagnosis not present

## 2024-04-26 DIAGNOSIS — F329 Major depressive disorder, single episode, unspecified: Secondary | ICD-10-CM | POA: Diagnosis not present

## 2024-04-26 DIAGNOSIS — M9908 Segmental and somatic dysfunction of rib cage: Secondary | ICD-10-CM | POA: Diagnosis not present

## 2024-04-26 DIAGNOSIS — M9901 Segmental and somatic dysfunction of cervical region: Secondary | ICD-10-CM | POA: Diagnosis not present

## 2024-04-26 DIAGNOSIS — G894 Chronic pain syndrome: Secondary | ICD-10-CM | POA: Diagnosis not present

## 2024-04-26 DIAGNOSIS — M542 Cervicalgia: Secondary | ICD-10-CM | POA: Diagnosis not present

## 2024-04-26 DIAGNOSIS — F419 Anxiety disorder, unspecified: Secondary | ICD-10-CM | POA: Diagnosis not present

## 2024-04-26 DIAGNOSIS — M9902 Segmental and somatic dysfunction of thoracic region: Secondary | ICD-10-CM | POA: Diagnosis not present

## 2024-04-26 NOTE — Telephone Encounter (Signed)
 Received demographics. Will update once PA is completed.

## 2024-04-27 DIAGNOSIS — M76822 Posterior tibial tendinitis, left leg: Secondary | ICD-10-CM | POA: Diagnosis not present

## 2024-04-27 DIAGNOSIS — M76821 Posterior tibial tendinitis, right leg: Secondary | ICD-10-CM | POA: Diagnosis not present

## 2024-04-29 ENCOUNTER — Other Ambulatory Visit: Payer: Self-pay | Admitting: Physical Medicine & Rehabilitation

## 2024-04-29 DIAGNOSIS — G43E09 Chronic migraine with aura, not intractable, without status migrainosus: Secondary | ICD-10-CM

## 2024-04-29 DIAGNOSIS — M7918 Myalgia, other site: Secondary | ICD-10-CM

## 2024-04-29 DIAGNOSIS — M797 Fibromyalgia: Secondary | ICD-10-CM

## 2024-04-29 DIAGNOSIS — G479 Sleep disorder, unspecified: Secondary | ICD-10-CM

## 2024-05-01 DIAGNOSIS — M76822 Posterior tibial tendinitis, left leg: Secondary | ICD-10-CM | POA: Diagnosis not present

## 2024-05-01 DIAGNOSIS — M76821 Posterior tibial tendinitis, right leg: Secondary | ICD-10-CM | POA: Diagnosis not present

## 2024-05-03 NOTE — Progress Notes (Signed)
 Called patients insurance [employer] [phone]. Was informed CPT 11104 & 11105 does not require a prior authorization. Ref#:[id number]

## 2024-05-09 ENCOUNTER — Ambulatory Visit (INDEPENDENT_AMBULATORY_CARE_PROVIDER_SITE_OTHER): Admitting: Neurology

## 2024-05-09 DIAGNOSIS — R202 Paresthesia of skin: Secondary | ICD-10-CM

## 2024-05-09 NOTE — Progress Notes (Signed)
 Punch Biopsy Procedure Note  Preprocedure Diagnosis: paresthesia of skin   Postprocedure Diagnosis: same  Locations: Site 1: right lateral distal leg;  Site 2: right lateral thigh  Indications: r/o small fiber neuropathy  Anesthesia: 5 mL Lidocaine  1% with epinephrine  Procedure Details Patient informed of the risks (including but not limited to bleeding, pain, infection, scar and infection) and benefits of the procedure.  Informed consent obtained.  The areas which were chosen for biopsy, as above, and surrounding areas were given a sterile prep using alcohol and iodine. The skin was then stretched perpendicular to the skin tension lines and sample removed using the 3 mm punch. Pressure applied, hemostasis achieved.   Dressing applied. The specimen(s) was sent for pathologic examination. The patient tolerated the procedure well.  Estimated Blood Loss: 0 ml  Condition: Stable  Complications: none.  Plan: 1. Instructed to keep the wound dry and covered for 24h and clean thereafter. 2. Warning signs of infection were reviewed.    Venetia Potters, MD [facility] Neurology

## 2024-05-10 DIAGNOSIS — M76822 Posterior tibial tendinitis, left leg: Secondary | ICD-10-CM | POA: Diagnosis not present

## 2024-05-10 DIAGNOSIS — M76821 Posterior tibial tendinitis, right leg: Secondary | ICD-10-CM | POA: Diagnosis not present

## 2024-05-17 ENCOUNTER — Encounter: Payer: Self-pay | Admitting: Physical Medicine & Rehabilitation

## 2024-05-17 ENCOUNTER — Encounter: Attending: Physical Medicine & Rehabilitation | Admitting: Physical Medicine & Rehabilitation

## 2024-05-17 VITALS — BP 100/69 | HR 114 | Ht 64.0 in | Wt 228.0 lb

## 2024-05-17 DIAGNOSIS — M76822 Posterior tibial tendinitis, left leg: Secondary | ICD-10-CM | POA: Diagnosis not present

## 2024-05-17 DIAGNOSIS — M7918 Myalgia, other site: Secondary | ICD-10-CM | POA: Diagnosis not present

## 2024-05-17 DIAGNOSIS — G43E09 Chronic migraine with aura, not intractable, without status migrainosus: Secondary | ICD-10-CM | POA: Diagnosis not present

## 2024-05-17 DIAGNOSIS — M797 Fibromyalgia: Secondary | ICD-10-CM | POA: Diagnosis not present

## 2024-05-17 DIAGNOSIS — G479 Sleep disorder, unspecified: Secondary | ICD-10-CM | POA: Insufficient documentation

## 2024-05-17 DIAGNOSIS — M76821 Posterior tibial tendinitis, right leg: Secondary | ICD-10-CM | POA: Diagnosis not present

## 2024-05-17 MED ORDER — CYCLOBENZAPRINE HCL 10 MG PO TABS: 10.0000 mg | ORAL_TABLET | Freq: Two times a day (BID) | ORAL | 3 refills | Status: DC | PRN

## 2024-05-17 MED ORDER — PREGABALIN 100 MG PO CAPS: 100.0000 mg | ORAL_CAPSULE | Freq: Three times a day (TID) | ORAL | 0 refills | Status: DC

## 2024-05-17 NOTE — Progress Notes (Signed)
 Subjective:    Patient ID: Madison Waters, female    DOB: 1980-10-27, 43 y.o.   MRN: [id number]  HPI  Madison Waters is here in follow up of her chronic pain syndrome. She had been sleeping well until she was started on a perimenopausal  drug by her obgyn which led to a lot of bleeding and ultimately it interfered with her sleep. Now she is up all night. She has been feeling light-headed at times. She hasn't had a h/h checked yet.  She takes trazodone 100 mg at bedtime as well.  She feels more depressed as a result of her bleeding as well as ongoing sleep issues.  She feels that she is having more discomfort in her neck and shoulder girdle in addition to having more headaches as a result of the neck pain.  Driving to work stresses her as she has a long commute.   Flexeril  had helped her quite a bit after she started it but it has not been as effective after the issues going on above.  She has been going to therapy for her foot. She had an injection to her right foot which helped as well.  She is working on stretching on her own.  She does do some walking and gets in the pool as well.  She continues to work as mentioned above.  Flexeril  dose is 5 mg twice daily as needed although she usually takes this at bedtime.  Lyrica  75 mg 3 times daily.  Sometimes she feels that she needs a bit higher dose during the midday.  She remains on Aimovig  and Nurtec per neurology.      Pain Inventory Average Pain 7 Pain Right Now 5 My pain is intermittent, sharp, and stabbing  In the last 24 hours, has pain interfered with the following? General activity 5 Relation with others 7 Enjoyment of life 9 What TIME of day is your pain at its worst? morning , daytime, evening, and night Sleep (in general) Poor  Pain is worse with: some activites and lifting Pain improves with: rest, heat/ice, therapy/exercise, medication, TENS, and injections Relief from Meds: fair      Family History  Problem Relation Age of  Onset   Stroke Mother    Heart disease Maternal Grandmother    Aneurysm Maternal Grandfather    Ovarian cancer Paternal Grandmother    Prostate cancer Paternal Grandfather    Social History   Socioeconomic History   Marital status: Married    Spouse name: Not on file   Number of children: Not on file   Years of education: Not on file   Highest education level: Not on file  Occupational History   Not on file  Tobacco Use   Smoking status: Never   Smokeless tobacco: Never  Vaping Use   Vaping status: Never Used  Substance and Sexual Activity   Alcohol use: Not Currently   Drug use: Never   Sexual activity: Not on file  Other Topics Concern   Not on file  Social History Narrative   Right handed   Social Drivers of Health   Financial Resource Strain: Not on file  Food Insecurity: Not on file  Transportation Needs: Not on file  Physical Activity: Not on file  Stress: Not on file  Social Connections: Unknown (01/28/2022)   [facility]   Social Network    Social Network: Not on file   Past Surgical History:  Procedure Laterality Date   TONSILLECTOMY  WRIST FRACTURE SURGERY Left    Past Medical History:  Diagnosis Date   Anemia    Asthma    GERD (gastroesophageal reflux disease)    High cholesterol    Migraines    Morbid (severe) obesity due to excess calories (HCC)    bmi 41.85   Ht 5' 4 (1.626 m)   Wt 228 lb (103.4 kg)   BMI 39.14 kg/m   Opioid Risk Score:   Fall Risk Score:  `1  Depression screen [facility] 2/9     05/17/2024    9:36 AM 03/01/2024    9:57 AM  Depression screen PHQ 2/9  Decreased Interest 1 2  Down, Depressed, Hopeless 1 0  PHQ - 2 Score 2 2  Altered sleeping  3  Tired, decreased energy  2  Change in appetite  0  Feeling bad or failure about yourself   1  Trouble concentrating  2  Moving slowly or fidgety/restless  0  Suicidal thoughts  0  PHQ-9 Score  10  Difficult doing work/chores  Somewhat difficult    Review  of Systems  Musculoskeletal:  Positive for gait problem and neck pain.       Pain in the left shoulder, left foot  All other systems reviewed and are negative.      Objective:   Physical Exam General: No acute distress HEENT: NCAT, EOMI, oral membranes moist Cards: reg rate  Chest: normal effort Abdomen: Soft, NT, ND Skin: dry, intact Extremities: no edema Psych: pleasant and appropriate  Skin: Clean and intact without signs of breakdown Neuro:  Alert and oriented x 3. Normal insight and awareness. Intact Memory. Normal language and speech. Cranial nerve exam unremarkable. MMT: 5/5 in all 4's. Sensory exam normal for light touch and pain in all 4 limbs. No limb ataxia or cerebellar signs. No abnormal tone appreciated.  .   Musculoskeletal: She has generalized tenderness in the neck and shoulder girdle but did not appreciate frank trigger points.  She has functional range of motion.  She moves her low back fairly well.  Did not palpate any focal trigger points today in her trapezius muscles or shoulder girdle in general.  Gait slightly wide-based.  She uses a cane for balance.  No joint deformities were appreciated in the hands or wrists.         Assessment & Plan:  Chronic pain syndrome/fibromyalgia Chronic widespread pain Sleep disorder Brain fog Depression Fatigue Migraine headaches. Myofascial pain   2. Cervical and lumbar DDD with spondylosis 3. Hx of + ANA 4. Hx of migraines prior to her current pain syndrome 5. Obesity         Plan: Low impact exercises, pool, stationary bike/.  Aquatic therapies are great Consider adjustment of Cymbalta, but for now we will leave at 90 mg daily Increase Lyrica  to 100 mg 3 times daily Adjust Flexeril  to 10 mg at bedtime and daily as needed Follow up with rheumatology as directed Pt wants to avoid narcotic rx.  7.   Weight loss is a key, especially for her lower body joint pain. She has lost 14lbs since beginning zepbound.  She  has noticed some improvement in her lower half pain 8.   Supps and food have been presented which can help with joint pain.    20 minutes of face to face patient care time were spent during this visit. All questions were encouraged and answered. Follow up with me in 2 mos. it was a pleasure to  meet Madison Waters today!

## 2024-05-17 NOTE — Patient Instructions (Addendum)
 ALWAYS FEEL FREE TO CALL OUR OFFICE WITH ANY PROBLEMS OR QUESTIONS [phone])  **PLEASE NOTE** ALL MEDICATION REFILL REQUESTS (INCLUDING CONTROLLED SUBSTANCES) NEED TO BE MADE AT LEAST 7 DAYS PRIOR TO REFILL BEING DUE. ANY REFILL REQUESTS INSIDE THAT TIME FRAME MAY RESULT IN DELAYS IN RECEIVING YOUR PRESCRIPTION.     TARGET 6-8 HOURS OF SLEEP  CHECK WITH GYN ABOUT YOUR HEMOGLOBIN AND PERIMENOPAUSAL DRUG

## 2024-05-19 DIAGNOSIS — D649 Anemia, unspecified: Secondary | ICD-10-CM | POA: Diagnosis not present

## 2024-05-19 DIAGNOSIS — N920 Excessive and frequent menstruation with regular cycle: Secondary | ICD-10-CM | POA: Diagnosis not present

## 2024-05-23 ENCOUNTER — Other Ambulatory Visit: Admitting: Neurology

## 2024-05-24 DIAGNOSIS — M76822 Posterior tibial tendinitis, left leg: Secondary | ICD-10-CM | POA: Diagnosis not present

## 2024-05-24 DIAGNOSIS — M76821 Posterior tibial tendinitis, right leg: Secondary | ICD-10-CM | POA: Diagnosis not present

## 2024-05-26 ENCOUNTER — Ambulatory Visit (HOSPITAL_BASED_OUTPATIENT_CLINIC_OR_DEPARTMENT_OTHER): Admitting: Physical Therapy

## 2024-06-02 DIAGNOSIS — M76822 Posterior tibial tendinitis, left leg: Secondary | ICD-10-CM | POA: Diagnosis not present

## 2024-06-02 DIAGNOSIS — S93491A Sprain of other ligament of right ankle, initial encounter: Secondary | ICD-10-CM | POA: Diagnosis not present

## 2024-06-02 DIAGNOSIS — M76821 Posterior tibial tendinitis, right leg: Secondary | ICD-10-CM | POA: Diagnosis not present

## 2024-06-06 DIAGNOSIS — M76821 Posterior tibial tendinitis, right leg: Secondary | ICD-10-CM | POA: Diagnosis not present

## 2024-06-06 DIAGNOSIS — M76822 Posterior tibial tendinitis, left leg: Secondary | ICD-10-CM | POA: Diagnosis not present

## 2024-06-08 ENCOUNTER — Encounter: Payer: Self-pay | Admitting: Neurology

## 2024-06-08 ENCOUNTER — Telehealth: Payer: Self-pay | Admitting: Neurology

## 2024-06-08 NOTE — Telephone Encounter (Signed)
 Pt calling for results

## 2024-06-09 DIAGNOSIS — M9907 Segmental and somatic dysfunction of upper extremity: Secondary | ICD-10-CM | POA: Diagnosis not present

## 2024-06-09 DIAGNOSIS — M9901 Segmental and somatic dysfunction of cervical region: Secondary | ICD-10-CM | POA: Diagnosis not present

## 2024-06-09 DIAGNOSIS — M25511 Pain in right shoulder: Secondary | ICD-10-CM | POA: Diagnosis not present

## 2024-06-09 DIAGNOSIS — M542 Cervicalgia: Secondary | ICD-10-CM | POA: Diagnosis not present

## 2024-06-09 NOTE — Telephone Encounter (Signed)
 LMOVM to call the office [place].

## 2024-06-13 DIAGNOSIS — N920 Excessive and frequent menstruation with regular cycle: Secondary | ICD-10-CM | POA: Diagnosis not present

## 2024-06-13 DIAGNOSIS — Z8711 Personal history of peptic ulcer disease: Secondary | ICD-10-CM | POA: Diagnosis not present

## 2024-06-13 DIAGNOSIS — M4802 Spinal stenosis, cervical region: Secondary | ICD-10-CM | POA: Diagnosis not present

## 2024-06-16 DIAGNOSIS — M76822 Posterior tibial tendinitis, left leg: Secondary | ICD-10-CM | POA: Diagnosis not present

## 2024-06-16 DIAGNOSIS — M76821 Posterior tibial tendinitis, right leg: Secondary | ICD-10-CM | POA: Diagnosis not present

## 2024-06-21 DIAGNOSIS — M76821 Posterior tibial tendinitis, right leg: Secondary | ICD-10-CM | POA: Diagnosis not present

## 2024-06-21 DIAGNOSIS — M76822 Posterior tibial tendinitis, left leg: Secondary | ICD-10-CM | POA: Diagnosis not present

## 2024-06-23 ENCOUNTER — Encounter: Payer: Self-pay | Admitting: Physical Medicine & Rehabilitation

## 2024-06-23 ENCOUNTER — Other Ambulatory Visit: Payer: Self-pay | Admitting: Physical Medicine & Rehabilitation

## 2024-06-23 DIAGNOSIS — M797 Fibromyalgia: Secondary | ICD-10-CM

## 2024-06-23 DIAGNOSIS — M7918 Myalgia, other site: Secondary | ICD-10-CM

## 2024-06-23 NOTE — Telephone Encounter (Signed)
 Rx for pregabalin  sent.

## 2024-06-27 DIAGNOSIS — M76822 Posterior tibial tendinitis, left leg: Secondary | ICD-10-CM | POA: Diagnosis not present

## 2024-06-27 DIAGNOSIS — M76821 Posterior tibial tendinitis, right leg: Secondary | ICD-10-CM | POA: Diagnosis not present

## 2024-07-03 DIAGNOSIS — M542 Cervicalgia: Secondary | ICD-10-CM | POA: Diagnosis not present

## 2024-07-03 DIAGNOSIS — G8929 Other chronic pain: Secondary | ICD-10-CM | POA: Diagnosis not present

## 2024-07-03 DIAGNOSIS — M9908 Segmental and somatic dysfunction of rib cage: Secondary | ICD-10-CM | POA: Diagnosis not present

## 2024-07-03 DIAGNOSIS — M25512 Pain in left shoulder: Secondary | ICD-10-CM | POA: Diagnosis not present

## 2024-07-03 DIAGNOSIS — M9907 Segmental and somatic dysfunction of upper extremity: Secondary | ICD-10-CM | POA: Diagnosis not present

## 2024-07-03 DIAGNOSIS — M9902 Segmental and somatic dysfunction of thoracic region: Secondary | ICD-10-CM | POA: Diagnosis not present

## 2024-07-03 DIAGNOSIS — M9901 Segmental and somatic dysfunction of cervical region: Secondary | ICD-10-CM | POA: Diagnosis not present

## 2024-07-11 DIAGNOSIS — N939 Abnormal uterine and vaginal bleeding, unspecified: Secondary | ICD-10-CM | POA: Diagnosis not present

## 2024-07-14 DIAGNOSIS — M19072 Primary osteoarthritis, left ankle and foot: Secondary | ICD-10-CM | POA: Diagnosis not present

## 2024-07-14 DIAGNOSIS — M549 Dorsalgia, unspecified: Secondary | ICD-10-CM | POA: Diagnosis not present

## 2024-07-14 DIAGNOSIS — S93491A Sprain of other ligament of right ankle, initial encounter: Secondary | ICD-10-CM | POA: Diagnosis not present

## 2024-07-19 ENCOUNTER — Encounter: Payer: Self-pay | Admitting: Physical Medicine & Rehabilitation

## 2024-07-19 ENCOUNTER — Encounter: Attending: Physical Medicine & Rehabilitation | Admitting: Physical Medicine & Rehabilitation

## 2024-07-19 VITALS — BP 89/68 | HR 111 | Ht 64.0 in | Wt 216.0 lb

## 2024-07-19 DIAGNOSIS — G479 Sleep disorder, unspecified: Secondary | ICD-10-CM | POA: Diagnosis not present

## 2024-07-19 DIAGNOSIS — M797 Fibromyalgia: Secondary | ICD-10-CM | POA: Diagnosis not present

## 2024-07-19 DIAGNOSIS — M7918 Myalgia, other site: Secondary | ICD-10-CM | POA: Insufficient documentation

## 2024-07-19 MED ORDER — PREGABALIN 150 MG PO CAPS
150.0000 mg | ORAL_CAPSULE | Freq: Three times a day (TID) | ORAL | 2 refills | Status: AC
Start: 2024-07-19 — End: ?

## 2024-07-19 NOTE — Patient Instructions (Addendum)
 ALWAYS FEEL FREE TO CALL OUR OFFICE WITH ANY PROBLEMS OR QUESTIONS [phone])  **PLEASE NOTE** ALL MEDICATION REFILL REQUESTS (INCLUDING CONTROLLED SUBSTANCES) NEED TO BE MADE AT LEAST 7 DAYS PRIOR TO REFILL BEING DUE. ANY REFILL REQUESTS INSIDE THAT TIME FRAME MAY RESULT IN DELAYS IN RECEIVING YOUR PRESCRIPTION.     IF LYRICA  150M THREE X DAILY IS TOO MUCH, BACK DOWN TO TWICE DAILY.        Address: [address] [place], [place] [address] Phone: [phone] Hours:  Open ? Closes 6?PM   DEPENDING UPON HOW YOU'RE DOING WITH YOUR TRIGGER POINTS AND SHOULDER, CALL ME 2 WEEKS PRIOR TO YOUR APPOINTMENT TO TELL ME IF YOU NEED SHOTS.

## 2024-07-19 NOTE — Progress Notes (Signed)
 Subjective:    Patient ID: Madison Waters, female    DOB: 10/16/1980, 43 y.o.   MRN: [id number]  HPI  [place] is here in follow up of her chronic pain and FMS.  She has had some ongoing pain related to her fibromyalgia but more specifically has had complaints of neck and shoulder pain with radiation into both arms with associated tingling.  She asked about the possibility of having some injections done on her I reviewed her MRI from about a year ago which revealed the following:   C2-3: Degenerative facet spurring asymmetric to the right. No impingement   C3-4: Narrow disc, incomplete segmentation. No degeneration or impingement   C4-5: Facet spurring and mild anterolisthesis. Mild disc bulging. No neural impingement   C5-6: Small central protrusion contacting the ventral cord. Negative facets. Patent foramina   C6-7: Small central protrusion. Negative facets. No neural impingement.   She is tolerating the Lyrica  100 mg 3 times daily and it seems to be helping her pain in the general sense.  Sleep is fair.  She has migraine headaches which seem to initiate from her neck and occipital region.  Neck.  She also takes cyclobenzaprine  for spasms as well as Cymbalta 90 mg daily.  She has continued to lose weight and has lost 35 pounds currently.  She continues to work full-time and is driving a lot, usually about an hour and 1/2/day.  Pain Inventory Average Pain 8 Pain Right Now 8 My pain is constant, burning, stabbing, tingling, and throbbing  In the last 24 hours, has pain interfered with the following? General activity 8 Relation with others 4 Enjoyment of life 8 What TIME of day is your pain at its worst? daytime and evening Sleep (in general) Good  Pain is worse with: walking, bending, standing, and some activites Pain improves with: rest and heat/ice Relief from Meds: 5  Family History  Problem Relation Age of Onset   Stroke Mother    Heart disease Maternal Grandmother     Aneurysm Maternal Grandfather    Ovarian cancer Paternal Grandmother    Prostate cancer Paternal Grandfather    Social History   Socioeconomic History   Marital status: Married    Spouse name: Not on file   Number of children: Not on file   Years of education: Not on file   Highest education level: Not on file  Occupational History   Not on file  Tobacco Use   Smoking status: Never   Smokeless tobacco: Never  Vaping Use   Vaping status: Never Used  Substance and Sexual Activity   Alcohol use: Not Currently   Drug use: Never   Sexual activity: Not on file  Other Topics Concern   Not on file  Social History Narrative   Right handed   Social Drivers of Health   Financial Resource Strain: Not on file  Food Insecurity: Not on file  Transportation Needs: Not on file  Physical Activity: Not on file  Stress: Not on file  Social Connections: Unknown (01/28/2022)   [facility] from [facility]   Social Network    Social Network: Not on file   Past Surgical History:  Procedure Laterality Date   TONSILLECTOMY     WRIST FRACTURE SURGERY Left    Past Surgical History:  Procedure Laterality Date   TONSILLECTOMY     WRIST FRACTURE SURGERY Left    Past Medical History:  Diagnosis Date   Anemia    Asthma  GERD (gastroesophageal reflux disease)    High cholesterol    Migraines    Morbid (severe) obesity due to excess calories (HCC)    bmi 41.85   BP (!) 89/68   Pulse (!) 111   Ht 5' 4 (1.626 m)   Wt 216 lb (98 kg)   SpO2 92%   BMI 37.08 kg/m   Opioid Risk Score:   Fall Risk Score:  `1  Depression screen [facility] 2/9     05/17/2024    9:36 AM 03/01/2024    9:57 AM  Depression screen PHQ 2/9  Decreased Interest 1 2  Down, Depressed, Hopeless 1 0  PHQ - 2 Score 2 2  Altered sleeping  3  Tired, decreased energy  2  Change in appetite  0  Feeling bad or failure about yourself   1  Trouble concentrating  2  Moving slowly or fidgety/restless  0  Suicidal  thoughts  0  PHQ-9 Score  10  Difficult doing work/chores  Somewhat difficult     Review of Systems  Musculoskeletal:  Positive for back pain.       Objective:   Physical Exam  Constitutional: No distress . Vital signs reviewed. She has lost weight HEENT: NCAT, EOMI, oral membranes moist Neck: supple Cardiovascular: RRR without murmur. No JVD    Respiratory/Chest: CTA Bilaterally without wheezes or rales. Normal effort    GI/Abdomen: BS +, non-tender, non-distended Ext: no clubbing, cyanosis, or edema Psych: pleasant and cooperative   Skin: Clean and intact without signs of breakdown Neuro:  Alert and oriented x 3. Normal insight and awareness. Intact Memory. Normal language and speech. Cranial nerve exam unremarkable. MMT: 5/5 in all 4's. Sensory exam normal for light touch and pain in all 4 limbs.  Deep tendon reflexes were 2+ in both upper extremities.  No limb ataxia or cerebellar signs. No abnormal tone appreciated.  .   Musculoskeletal: Patient with notable trigger points today through the upper right trap, sternocleidomastoid, scalenes and levator scapula on the right side.  May have had some lower down toward the rhomboids as well.  Right shoulder was notable for pain with internal rotation.  Also had pain with abduction.  Impingement maneuver was positive on the right side. Assessment & Plan:  Chronic pain syndrome/fibromyalgia Chronic widespread pain Sleep disorder Brain fog Depression Fatigue Migraine headaches. Myofascial pain   2. Cervical and lumbar DDD with spondylosis 3. Hx of + ANA 4. Hx of migraines prior to her current pain syndrome 5. Obesity 6. Right RTC syndrome         Plan: Referral to [facility] for myofascial and postural mgt/ R RTC mgt  -consider TPI's and right shoulder injection if therapy unsuccessful Cymbalta,   90 mg daily for now Increase  Lyrica  to 150 mg 3 times daily Adjust Flexeril  to 10 mg at bedtime and daily as  needed Follow up with rheumatology as directed Pt wants to avoid narcotic rx.  7.   Doing a great job with weigh loss on zepbound--35lbs!.    8.   Supps and food have been presented which can help with joint pain.

## 2024-07-20 DIAGNOSIS — M19072 Primary osteoarthritis, left ankle and foot: Secondary | ICD-10-CM | POA: Diagnosis not present

## 2024-07-23 ENCOUNTER — Other Ambulatory Visit: Payer: Self-pay | Admitting: Neurology

## 2024-07-27 ENCOUNTER — Ambulatory Visit: Admitting: Neurology

## 2024-07-27 DIAGNOSIS — M5414 Radiculopathy, thoracic region: Secondary | ICD-10-CM | POA: Diagnosis not present

## 2024-07-27 DIAGNOSIS — M549 Dorsalgia, unspecified: Secondary | ICD-10-CM | POA: Diagnosis not present

## 2024-07-27 DIAGNOSIS — M5134 Other intervertebral disc degeneration, thoracic region: Secondary | ICD-10-CM | POA: Diagnosis not present

## 2024-07-28 DIAGNOSIS — R7301 Impaired fasting glucose: Secondary | ICD-10-CM | POA: Diagnosis not present

## 2024-07-28 DIAGNOSIS — Z862 Personal history of diseases of the blood and blood-forming organs and certain disorders involving the immune mechanism: Secondary | ICD-10-CM | POA: Diagnosis not present

## 2024-08-01 NOTE — Progress Notes (Deleted)
 NEUROLOGY FOLLOW UP OFFICE NOTE  Madison Waters [id number]  Assessment/Plan:   Migraine without aura, without status migrainosus, not intractable Chronic pain syndrome with subjective weakness, neck/back pain, gait instability, myalgias, polyarthralgias.  Borderline positive ANA but autoimmune and neurologic workup negative.   Subjective memory and speech difficulty - unclear if related to low B12.   Migraine prevention:  Aimovig  140mg   Migraine rescue:  sumatriptan  100mg , Nurtec Limit use of pain relievers to no more than 9 days out of the month to prevent risk of rebound or medication-overuse headache. If pain continues to be a problem, PCP may consider referral to pain specialist. Follow up with me ***      Subjective:  Madison Waters is a 43 year old female with asthma, iron-deficiency anemia, and tinnitus who follows up for migraines and other symptomology.  MRI of brain, thoracic and lumbar spine personally reviewed.  UPDATE: Migraines: Intensity:  moderate to severe Duration:  1 hour to several hours with sumatriptan  Frequency:  2 migraines in last 3 months.  Tend to occur leading up to menses Current NSAIDS/analgesics:  none Current triptans:  sumatriptan  100mg  Current ergotamine:  none Current anti-emetic:  Zofran  4mg ; promethazine  25mg  (sometimes) Current muscle relaxants:  none Current Antihypertensive medications:  none Current Antidepressant medications:  duloxetine 90mg  dail Current Anticonvulsant medications:  pregablin 75mg  twice daily. Current anti-CGRP:  Aimovig  140mg  Current Vitamins/Herbal/Supplements:  ferrous sulfate Current Antihistamines/Decongestants:  meclizine, zyrtec, Flonase Other therapy:  ice pack Hormone/birth control:  none  Other symptomatology: For completeness of workup, she had a NCV-EMG of the right arm and leg on 01/19/2024 which was normal.  Punch skin biopsy peformed on 03/10/2024 was negative for small fiber neuropathy.   Caffeine:   no Diet:  3 liters of water daily.  Avoids soda, spicy foods, citrus, cut out most sugar, may skip lunch Exercise:  not routine Depression:  stable; Anxiety:  stable Other pain:  joint pain, myalgia Sleep hygiene:  varies.  May take an hour to fall asleep and may wake up several times during the night.  Wakes up with migraines (3 AM)  HISTORY:  Migraines: Onset:  43 years old.  Became daily since having COVID in end of August 2023. Location:  varies - temples, behind eyes, back of neck Quality:  initially pressure but progresses to throbbing/pounding Intensity:  Severe Aura:  absent Prodrome:  absent Associated symptoms:  Nausea, photophobia, phonophobia, scalp tenderness, joint pain, sometimes blurred vision.  She denies associated unilateral numbness or weakness. Duration:  1 to 2 hours if treated early, otherwise all day Frequency:  Prior to August 2023 - 5-10 times a month; Since August 2023 - daily Frequency of abortive medication: Tylenol and/or Excedrin daily Triggers:  menses, change in weather, sleep deprivation, stress Relieving factors:  ice pack Activity:  affects daily activity.     Past NSAIDS/analgesics:  ibuprofen (GI problems), Tylenol, Excedrin Past abortive triptans:  rizatriptan Past abortive ergotamine:  none Past muscle relaxants:  none Past anti-emetic:  Zofran  Past antihypertensive medications: none Past antidepressant medications:  venlafaxine (side effects) Past anticonvulsant medications:  topiramate (adverse reaction), gabapentin Past anti-CGRP:  Ubrelvy 100mg  (not helpful) Other past therapies:  none  Other symptomatology including weakness, neck/back pain, gait instability, memory and speech problems, myalgias, polyarthralgias. She has been having multiple problems beginning in 2024.  She has been feeling excessive fatigue.  When she stands up, she fells off balance.  She is tripping over herself.  Notes asymmetric right sided  weakness.  Arms and  hands may start shaking.  She reports urinary urge incontinence and constipation.  Reports increased cognitive difficulty.  She has word-finding difficulty, difficulty remembering how to spell some words.  Trouble multitasking.  She notes sensory changes.  Last 3 fingers of each hand tingles.  Fingers go numb.  Hands feel cold.  Big toe feels numb.  Feet tingle.  Sometimes her fingers and toes become white.  Reports diffuse joint pain and muscle aches.  She has been experiencing neck and back pain.  MRI of of cervical spine on 09/16/2023 personally reviewed revealed mild degenerative changes particularly at C3-4 and C5-6 but no significant stenosis.  Treated with PT as well as epidural injections.  Has been in physical therapy, including aquatic therapy.  ANA was positive (1:80 titer, heterogeneous).  Concerned about MS.  She has been unable to work full-time.  Underwent workup. Labs from 11/11/2023 again revealed positive ANA (1:80, speckled), negative ENA panel, CK 70, aldolase 5.2, TSH 2.40, folate 10.8 and B12 308, Advised to start OTC B12 1000mcg daily.  Has not noticed improvement.  MRI of brain with and without contrast on 12/12/2023 was normal.    MRI of lumbar spine without contrast on 11/27/2023 showed lower lumbar degeneration with desiccation and mild disc bulging and mild facet and ligamentous hypertrophy at L4-5 and minimal face osteoarthritis at L5-S1 without canal or foraminal stenosis.  She finished PT.  She presented to the ED on 01/02/2024 with left sided flank pain and shortness of breath.  MRI of thoracic spine without contrast revealed age-advanced multilevel degenerative spine and disc disease with moderate sized disc herniation at T7-T8 without spinal stenosis and mild right T10 foraminal stenosis.  She had another MRI of lumbar spine without contrast now showed apparent transitional lumbosacral anatomy with lumbarized S1 and full size S1-S2 disc and lower lumbar degeneration without stenosis or  acute or inflammatory process.  CXR, CT A/P and d-dimer negative.  Discharged on methocarbamol .  She has seen rheumatology.  Labs came back negative.  She was diagnosed with fibromyalgia.  Increased duloxetine and has been referred to pain management.  Notes fingers and toes feel tingling and cold but also feels cold internally.    Most family history is unknown as she was adopted.    PAST MEDICAL HISTORY: Past Medical History:  Diagnosis Date   Anemia    Asthma    GERD (gastroesophageal reflux disease)    High cholesterol    Migraines    Morbid (severe) obesity due to excess calories (HCC)    bmi 41.85    MEDICATIONS: Current Outpatient Medications on File Prior to Visit  Medication Sig Dispense Refill   ADVAIR DISKUS 250-50 MCG/ACT AEPB Inhale 1 puff into the lungs 2 (two) times daily.     albuterol (VENTOLIN HFA) 108 (90 Base) MCG/ACT inhaler Inhale 2 puffs into the lungs every 6 (six) hours as needed.     cyclobenzaprine  (FLEXERIL ) 10 MG tablet Take 1 tablet (10 mg total) by mouth 2 (two) times daily as needed for muscle spasms. Take one at bedtime and daily as needed for spasms 60 tablet 3   DULoxetine (CYMBALTA) 30 MG capsule Take 90 mg by mouth daily.     Erenumab -aooe (AIMOVIG ) 140 MG/ML SOAJ INJECT 140 MG INTO THE SKIN EVERY 28 (TWENTY-EIGHT) DAYS. 1 mL 0   Ferric Maltol (ACCRUFER) 30 MG CAPS Take 1 capsule every day by oral route.     ipratropium (ATROVENT ) 0.03 % nasal  spray Place 2 sprays into both nostrils every 12 (twelve) hours. 30 mL 0   pantoprazole (PROTONIX) 40 MG tablet Take 40 mg by mouth 2 (two) times daily.     pregabalin  (LYRICA ) 150 MG capsule Take 1 capsule (150 mg total) by mouth 3 (three) times daily. 90 capsule 2   promethazine  (PHENERGAN ) 25 MG tablet Take 1 tablet (25 mg total) by mouth every 6 (six) hours as needed for nausea or vomiting. 30 tablet 5   Rimegepant Sulfate (NURTEC) 75 MG TBDP Take 1 tablet by mouth daily as needed.     tirzepatide  (ZEPBOUND) 12.5 MG/0.5ML Pen Inject 12.5 mg into the skin once a week.     traZODone (DESYREL) 100 MG tablet Take 100 mg by mouth at bedtime.     No current facility-administered medications on file prior to visit.    ALLERGIES: Allergies  Allergen Reactions   Sulfa Antibiotics Hives and Rash    FAMILY HISTORY: Family History  Problem Relation Age of Onset   Stroke Mother    Heart disease Maternal Grandmother    Aneurysm Maternal Grandfather    Ovarian cancer Paternal Grandmother    Prostate cancer Paternal Grandfather       Objective:  *** General: No acute distress.  Patient appears well-groomed.   Head:  Normocephalic/atraumatic Neck:  Supple.  No paraspinal tenderness.  Full range of motion. Heart:  Regular rate and rhythm. Neuro:  Alert and oriented.  Speech fluent and not dysarthric.  Language intact.  CN II-XII intact.  Bulk and tone normal.  Muscle strength 5/5 throughout.  Sensation to light touch intact.  Deep tendon reflexes 2+ throughout, toes downgoing.  Gait normal.  Romberg negative.    Juliene Dunnings, DO  CC: Izetta Cork, NP

## 2024-08-02 ENCOUNTER — Ambulatory Visit: Admitting: Neurology

## 2024-08-02 DIAGNOSIS — N939 Abnormal uterine and vaginal bleeding, unspecified: Secondary | ICD-10-CM | POA: Diagnosis not present

## 2024-08-04 DIAGNOSIS — J4520 Mild intermittent asthma, uncomplicated: Secondary | ICD-10-CM | POA: Diagnosis not present

## 2024-08-04 DIAGNOSIS — F418 Other specified anxiety disorders: Secondary | ICD-10-CM | POA: Diagnosis not present

## 2024-08-04 DIAGNOSIS — M797 Fibromyalgia: Secondary | ICD-10-CM | POA: Diagnosis not present

## 2024-08-09 DIAGNOSIS — M357 Hypermobility syndrome: Secondary | ICD-10-CM | POA: Diagnosis not present

## 2024-08-09 DIAGNOSIS — M6281 Muscle weakness (generalized): Secondary | ICD-10-CM | POA: Diagnosis not present

## 2024-08-09 DIAGNOSIS — M5459 Other low back pain: Secondary | ICD-10-CM | POA: Diagnosis not present

## 2024-08-09 DIAGNOSIS — M542 Cervicalgia: Secondary | ICD-10-CM | POA: Diagnosis not present

## 2024-08-09 NOTE — Progress Notes (Signed)
   LILLETTE Ileana Collet, PhD, LAT, ATC acting as a scribe for Artist Lloyd, MD.  Madison Waters is a 43 y.o. female who presents to [facility] at [facility] today for evaluation of her hypermobility. +dental crowding  MS: chronic bilat ankle pain, Chronic joint pain, Chronic wide spread muscle pain, Joint Hypermobility, Joint Instability, Joint Subluxation, and Slipping Ribs / Costochondritis  Skin/Immune reactions: Hyperextensible/ stretchy Skin, Fragile Skin that tears easily, Soft Velvety Skin, Poor Wound Healing, Easy Bruising / Bleeding, Atrophic Scars, Abnormal Stretch Marks Before Puberty / without significant weight gain , and Rashes / Hives Neurological: Headache  Migraine and Burning, Numbness and/or Tingling in Extremities ANS: Syncope / Pre-Syncope / Dizziness, Fatigue, Exercise / Temperature Intolerance , and Anxiety / Panic Respiratory: Dyspnea and Chest Tightness CV: palpitations GI:  GERD, IBS with Diarrhea or Constipation, Gastroparesis, Painful ABD Bloating, Frequent N/V, and Hernia (umbilical) Genitourinary: Incontinence, Recurrent UTI / Cystitis, and Painful Intercourse Hands & Feet: Long Slender Fingers / Toes, Piezogenic Papules, and Flat Feet   Treatments tried: 1st visit @ [facility] 11/12, PT @ [facility] June-Oct, aquatic therapy Feb-May, cervical ESI, HEP, treatments/OMT w/ Dr. Marquette, ambulates w/ a cane  Pertinent review of systems: No fevers or chills  Relevant historical information: Myofascial pain and fibromyalgia.   Exam:  BP 96/68   Pulse (!) 111   Ht 5' 4 (1.626 m)   Wt 215 lb (97.5 kg)   SpO2 98%   BMI 36.90 kg/m  General: Well Developed, well nourished, and in no acute distress.   MSK: Hypermobile evaluation positive with a Beighton score of 5/9. Ehlers-Danlos evaluation is positive with a score of 5 positive for unusually soft velvety skin, mild skin hyperextensibility, unexplained stretch marks occurring in  adolescence, bilateral piezogenic papules of the heel, dental crowding and a high palate.    Lab and Radiology Results No results found for this or any previous visit (from the past 72 hours). No results found.     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  She is a great candidate for physical therapy.  She is already getting started with an excellent physical therapist.  Additionally provided lots of information about POTS/dysautonomia Ehlers-Danlos mast cell activation syndrome as well as some recommended reading. Plan to recheck in 1 month.   PDMP not reviewed this encounter. No orders of the defined types were placed in this encounter.  No orders of the defined types were placed in this encounter.    Discussed warning signs or symptoms. Please see discharge instructions. Patient expresses understanding.   The above documentation has been reviewed and is accurate and complete Artist Lloyd, M.D. Total encounter time 30 minutes including face-to-face time with the patient and, reviewing past medical record, and charting on the date of service.

## 2024-08-10 ENCOUNTER — Encounter: Payer: Self-pay | Admitting: Family Medicine

## 2024-08-10 ENCOUNTER — Ambulatory Visit (INDEPENDENT_AMBULATORY_CARE_PROVIDER_SITE_OTHER): Admitting: Family Medicine

## 2024-08-10 ENCOUNTER — Encounter: Payer: Self-pay | Admitting: Physical Medicine & Rehabilitation

## 2024-08-10 VITALS — BP 96/68 | HR 111 | Ht 64.0 in | Wt 215.0 lb

## 2024-08-10 DIAGNOSIS — Q7962 Hypermobile Ehlers-Danlos syndrome: Secondary | ICD-10-CM | POA: Insufficient documentation

## 2024-08-10 DIAGNOSIS — M546 Pain in thoracic spine: Secondary | ICD-10-CM | POA: Diagnosis not present

## 2024-08-10 NOTE — Telephone Encounter (Signed)
 Forwarding to Dr. Denyse Amass to review and advise.

## 2024-08-10 NOTE — Patient Instructions (Addendum)
 Thank you for coming in today.   Consume 8grams of salt and 3L water daily.  Check out the book Disjointed Navigating the Diagnosis and Management of Hypermobile Ehlers-Danlos Syndrome and Hypermobility Spectrum Disorders.   Continue working with Darryle at [facility].  Check back in 1 month

## 2024-08-14 ENCOUNTER — Encounter (HOSPITAL_BASED_OUTPATIENT_CLINIC_OR_DEPARTMENT_OTHER): Payer: Self-pay | Admitting: Physical Therapy

## 2024-08-17 DIAGNOSIS — M5459 Other low back pain: Secondary | ICD-10-CM | POA: Diagnosis not present

## 2024-08-17 DIAGNOSIS — M542 Cervicalgia: Secondary | ICD-10-CM | POA: Diagnosis not present

## 2024-08-17 DIAGNOSIS — M357 Hypermobility syndrome: Secondary | ICD-10-CM | POA: Diagnosis not present

## 2024-08-17 DIAGNOSIS — M6281 Muscle weakness (generalized): Secondary | ICD-10-CM | POA: Diagnosis not present

## 2024-08-22 DIAGNOSIS — M6281 Muscle weakness (generalized): Secondary | ICD-10-CM | POA: Diagnosis not present

## 2024-08-22 DIAGNOSIS — M357 Hypermobility syndrome: Secondary | ICD-10-CM | POA: Diagnosis not present

## 2024-08-22 DIAGNOSIS — M542 Cervicalgia: Secondary | ICD-10-CM | POA: Diagnosis not present

## 2024-08-22 DIAGNOSIS — M5459 Other low back pain: Secondary | ICD-10-CM | POA: Diagnosis not present

## 2024-08-23 DIAGNOSIS — M5459 Other low back pain: Secondary | ICD-10-CM | POA: Diagnosis not present

## 2024-08-23 DIAGNOSIS — M357 Hypermobility syndrome: Secondary | ICD-10-CM | POA: Diagnosis not present

## 2024-08-23 DIAGNOSIS — M542 Cervicalgia: Secondary | ICD-10-CM | POA: Diagnosis not present

## 2024-08-23 DIAGNOSIS — M6281 Muscle weakness (generalized): Secondary | ICD-10-CM | POA: Diagnosis not present

## 2024-08-29 DIAGNOSIS — M542 Cervicalgia: Secondary | ICD-10-CM | POA: Diagnosis not present

## 2024-08-29 DIAGNOSIS — M357 Hypermobility syndrome: Secondary | ICD-10-CM | POA: Diagnosis not present

## 2024-08-29 DIAGNOSIS — M5459 Other low back pain: Secondary | ICD-10-CM | POA: Diagnosis not present

## 2024-08-29 DIAGNOSIS — M6281 Muscle weakness (generalized): Secondary | ICD-10-CM | POA: Diagnosis not present

## 2024-08-30 ENCOUNTER — Other Ambulatory Visit: Payer: Self-pay | Admitting: Neurology

## 2024-08-30 ENCOUNTER — Encounter: Payer: Self-pay | Admitting: Neurology

## 2024-08-30 MED ORDER — AIMOVIG 140 MG/ML ~~LOC~~ SOAJ: 140.0000 mg | SUBCUTANEOUS | 0 refills | Status: AC

## 2024-08-31 DIAGNOSIS — M5459 Other low back pain: Secondary | ICD-10-CM | POA: Diagnosis not present

## 2024-08-31 DIAGNOSIS — M542 Cervicalgia: Secondary | ICD-10-CM | POA: Diagnosis not present

## 2024-08-31 DIAGNOSIS — M357 Hypermobility syndrome: Secondary | ICD-10-CM | POA: Diagnosis not present

## 2024-08-31 DIAGNOSIS — M6281 Muscle weakness (generalized): Secondary | ICD-10-CM | POA: Diagnosis not present

## 2024-09-05 DIAGNOSIS — M6281 Muscle weakness (generalized): Secondary | ICD-10-CM | POA: Diagnosis not present

## 2024-09-05 DIAGNOSIS — M5459 Other low back pain: Secondary | ICD-10-CM | POA: Diagnosis not present

## 2024-09-05 DIAGNOSIS — M542 Cervicalgia: Secondary | ICD-10-CM | POA: Diagnosis not present

## 2024-09-05 DIAGNOSIS — M357 Hypermobility syndrome: Secondary | ICD-10-CM | POA: Diagnosis not present

## 2024-09-08 ENCOUNTER — Other Ambulatory Visit: Payer: Self-pay | Admitting: Physical Medicine & Rehabilitation

## 2024-09-08 DIAGNOSIS — G479 Sleep disorder, unspecified: Secondary | ICD-10-CM

## 2024-09-08 DIAGNOSIS — G43E09 Chronic migraine with aura, not intractable, without status migrainosus: Secondary | ICD-10-CM

## 2024-09-08 DIAGNOSIS — M7918 Myalgia, other site: Secondary | ICD-10-CM

## 2024-09-08 DIAGNOSIS — M797 Fibromyalgia: Secondary | ICD-10-CM

## 2024-09-08 NOTE — Progress Notes (Signed)
° °  LILLETTE Ileana Collet, PhD, LAT, ATC acting as a scribe for Artist Lloyd, MD.  Madison Waters is a 43 y.o. female who presents to [facility] at [facility] today for 45-month f/u hEDS. Pt was last seen by Dr. Lloyd on 08/10/24 and was provided handouts and advised to cont PT.  Today, pt reports she cont'd PT until last week do to insurance max coverage. She has multiple HEP. Pt c/o L sided periscapular and L rib pain. R shoulder pain is much improved.  She is interested in some intermittent tramadol  that she can take as needed.  She does not expect to use it frequently.  This has helped her in the past.  She does see Dr. Marquette for intermittent osteopathic manipulations which have helped in the past as well.  Pertinent review of systems: No fevers or chills  Relevant historical information: Myofascial pain and hypermobile Ehlers-Danlos syndrome.   Exam:  BP 112/70   Pulse (!) 106   Ht 5' 4 (1.626 m)   Wt 210 lb (95.3 kg)   SpO2 97%   BMI 36.05 kg/m  General: Well Developed, well nourished, and in no acute distress.   MSK: T-spine normal-appearing nontender to palpation midline.  Tender palpation paraspinal musculature.    Lab and Radiology Results No results found for this or any previous visit (from the past 72 hours). No results found.     Assessment and Plan: 43 y.o. female with chronic pain secondary to hypermobile EDS and some fibromyalgia.  She does have degenerative changes visible on T-spine MRI from April of this year.  This could be a factor as well.  Plan to continue home exercise program and resume PT when able.  She does have cyclobenzaprine  that she can take as needed and is currently managing overall pain with pregabalin  and duloxetine.  I have prescribed a little bit of tramadol  that she can use intermittently.  Recheck in 3 months or sooner if needed. /  PDMP reviewed during this encounter. No orders of the defined types were placed in this  encounter.  Meds ordered this encounter  Medications   traMADol  (ULTRAM ) 50 MG tablet    Sig: Take 1 tablet (50 mg total) by mouth every 8 (eight) hours as needed for severe pain (pain score 7-10).    Dispense:  15 tablet    Refill:  0     Discussed warning signs or symptoms. Please see discharge instructions. Patient expresses understanding.   The above documentation has been reviewed and is accurate and complete Artist Lloyd, M.D.  Total encounter time 30 minutes including face-to-face time with the patient and, reviewing past medical record, and charting on the date of service.

## 2024-09-11 ENCOUNTER — Ambulatory Visit: Admitting: Family Medicine

## 2024-09-11 VITALS — BP 112/70 | HR 106 | Ht 64.0 in | Wt 210.0 lb

## 2024-09-11 DIAGNOSIS — Q7962 Hypermobile Ehlers-Danlos syndrome: Secondary | ICD-10-CM | POA: Diagnosis not present

## 2024-09-11 DIAGNOSIS — M797 Fibromyalgia: Secondary | ICD-10-CM | POA: Diagnosis not present

## 2024-09-11 MED ORDER — TRAMADOL HCL 50 MG PO TABS: 50.0000 mg | ORAL_TABLET | Freq: Three times a day (TID) | ORAL | 0 refills | Status: DC | PRN

## 2024-09-11 NOTE — Patient Instructions (Addendum)
 Thank you for coming in today.   Continue physical therapy and home exercises  Continue visits with Dr. Marquette Bouillon sent a prescription for Tramadol  to your pharmacy.   Check back in 3 months

## 2024-09-20 ENCOUNTER — Encounter: Attending: Physical Medicine & Rehabilitation | Admitting: Physical Medicine & Rehabilitation

## 2024-09-20 ENCOUNTER — Encounter: Payer: Self-pay | Admitting: Physical Medicine & Rehabilitation

## 2024-09-20 VITALS — BP 97/65 | HR 101 | Ht 64.0 in | Wt 206.0 lb

## 2024-09-20 DIAGNOSIS — Q7962 Hypermobile Ehlers-Danlos syndrome: Secondary | ICD-10-CM | POA: Diagnosis not present

## 2024-09-20 DIAGNOSIS — T148XXA Other injury of unspecified body region, initial encounter: Secondary | ICD-10-CM | POA: Diagnosis not present

## 2024-09-20 DIAGNOSIS — G479 Sleep disorder, unspecified: Secondary | ICD-10-CM | POA: Diagnosis not present

## 2024-09-20 DIAGNOSIS — M797 Fibromyalgia: Secondary | ICD-10-CM | POA: Diagnosis not present

## 2024-09-20 LAB — CBC

## 2024-09-20 NOTE — Progress Notes (Signed)
 "  NEUROLOGY FOLLOW UP OFFICE NOTE  AMBUR PROVINCE [id number]  Assessment/Plan:   Migraine without aura, without status migrainosus, not intractable Paresthesias.  Negative workup.  Likely secondary to fibromyalgia.    Migraine prevention:  Aimovig  140mg   Migraine rescue:  sumatriptan  100mg , Nurtec Limit use of pain relievers to no more than 9 days out of the month to prevent risk of rebound or medication-overuse headache. Keep headache diary Follow up one year      Subjective:  Madison Waters is a 43 year old female with Ehlers-Danlos syndrome, dysautonomia, asthma, iron-deficiency anemia, and tinnitus who follows up for migraines and other symptomology.    UPDATE: Migraines: On Aimovig . Intensity:  moderate to severe Duration:  1 hour with Nurtec Frequency:  2 migraines in last 3 months.  Tend to occur leading up to menses Current NSAIDS/analgesics:  none Current triptans:  none Current ergotamine:  none Current anti-emetic:  Zofran  4mg ; promethazine  25mg  (sometimes) Current muscle relaxants:  none Current Antihypertensive medications:  none Current Antidepressant medications:  duloxetine 90mg  dail Current Anticonvulsant medications:  pregablin 75mg  twice daily. Current anti-CGRP:  Aimovig  140mg  Current Vitamins/Herbal/Supplements:  ferrous sulfate Current Antihistamines/Decongestants:  meclizine, zyrtec, Flonase Other therapy:  ice pack Hormone/birth control:  none  Paresthesias: Last visit in April, she endorsed fingers and toes feel tingling and cold but also feels cold internally. NCV-EMG of left upper and right lower extremities on 01/19/2024 was normal. Since last visit, she has been diagnosed with POTS.    Back and neck pain: Already has been to PT.  Going to [facility].  Also seeing Dr. Joane and Dr. Babs.    Caffeine:  no Diet:  3 liters of water daily.  Avoids soda, spicy foods, citrus, cut out most sugar, may skip lunch Exercise:  not  routine Depression:  stable; Anxiety:  stable Other pain:  joint pain, myalgia Sleep hygiene:  varies.  May take an hour to fall asleep and may wake up several times during the night.  Wakes up with migraines (3 AM)  HISTORY:  Migraines: Onset:  43 years old.  Became daily since having COVID in end of August 2023. Location:  varies - temples, behind eyes, back of neck Quality:  initially pressure but progresses to throbbing/pounding Intensity:  Severe Aura:  absent Prodrome:  absent Associated symptoms:  Nausea, photophobia, phonophobia, scalp tenderness, joint pain, sometimes blurred vision.  She denies associated unilateral numbness or weakness. Duration:  1 to 2 hours if treated early, otherwise all day Frequency:  Prior to August 2023 - 5-10 times a month; Since August 2023 - daily Frequency of abortive medication: Tylenol and/or Excedrin daily Triggers:  menses, change in weather, sleep deprivation, stress Relieving factors:  ice pack Activity:  affects daily activity.     Past NSAIDS/analgesics:  ibuprofen (GI problems), Tylenol, Excedrin Past abortive triptans:  rizatriptan, sumatriptan  tab Past abortive ergotamine:  none Past muscle relaxants:  none Past anti-emetic:  Zofran  Past antihypertensive medications: none Past antidepressant medications:  venlafaxine (side effects) Past anticonvulsant medications:  topiramate (adverse reaction), gabapentin Past anti-CGRP:  Ubrelvy 100mg  (not helpful) Other past therapies:  none  Fibromyalgia: She has been having multiple problems beginning in 2024.  She has been feeling excessive fatigue.  When she stands up, she fells off balance.  She is tripping over herself.  Notes asymmetric right sided weakness.  Arms and hands may start shaking.  She reports urinary urge incontinence and constipation.  Reports increased cognitive difficulty.  She  has word-finding difficulty, difficulty remembering how to spell some words.  Trouble  multitasking.  She notes sensory changes.  Last 3 fingers of each hand tingles.  Fingers go numb.  Hands feel cold.  Big toe feels numb.  Feet tingle.  Sometimes her fingers and toes become white.  Reports diffuse joint pain and muscle aches.  She has been experiencing neck and back pain.  MRI of of cervical spine on 09/16/2023 personally reviewed revealed mild degenerative changes particularly at C3-4 and C5-6 but no significant stenosis.  Treated with PT as well as epidural injections.  Has been in physical therapy, including aquatic therapy.  ANA was positive (1:80 titer, heterogeneous).  She has been unable to work full-time.  She was concerned about MS.  Labs from 11/11/2023 again revealed positive ANA (1:80, speckled), negative ENA panel, CK 70, aldolase 5.2, TSH 2.40, folate 10.8 and B12 308, Advised to start OTC B12 1000mcg daily.  Has not noticed improvement.  MRI of brain with and without contrast on 12/12/2023 was normal.     MRI of lumbar spine without contrast on 11/27/2023 showed lower lumbar degeneration with desiccation and mild disc bulging and mild facet and ligamentous hypertrophy at L4-5 and minimal face osteoarthritis at L5-S1 without canal or foraminal stenosis.  She finished PT.  She presented to the ED on 01/02/2024 with left sided flank pain and shortness of breath.  MRI of thoracic spine without contrast revealed age-advanced multilevel degenerative spine and disc disease with moderate sized disc herniation at T7-T8 without spinal stenosis and mild right T10 foraminal stenosis.  She had another MRI of lumbar spine without contrast now showed apparent transitional lumbosacral anatomy with lumbarized S1 and full size S1-S2 disc and lower lumbar degeneration without stenosis or acute or inflammatory process.  CXR, CT A/P and d-dimer negative.  Discharged on methocarbamol .  She has seen rheumatology.  Labs came back negative.  She was diagnosed with fibromyalgia.   Most family history is unknown  as she was adopted.    PAST MEDICAL HISTORY: Past Medical History:  Diagnosis Date   Anemia    Asthma    GERD (gastroesophageal reflux disease)    High cholesterol    Migraines    Morbid (severe) obesity due to excess calories (HCC)    bmi 41.85    MEDICATIONS: Current Outpatient Medications on File Prior to Visit  Medication Sig Dispense Refill   ADVAIR DISKUS 250-50 MCG/ACT AEPB Inhale 1 puff into the lungs 2 (two) times daily.     albuterol (VENTOLIN HFA) 108 (90 Base) MCG/ACT inhaler Inhale 2 puffs into the lungs every 6 (six) hours as needed.     cyclobenzaprine  (FLEXERIL ) 10 MG tablet TAKE 1 TABLET (10 MG TOTAL) BY MOUTH 2 (TWO) TIMES DAILY AS NEEDED FOR MUSCLE SPASMS. TAKE ONE AT BEDTIME AND DAILY AS NEEDED FOR SPASMS 60 tablet 3   DULoxetine (CYMBALTA) 30 MG capsule Take 90 mg by mouth daily.     Erenumab -aooe (AIMOVIG ) 140 MG/ML SOAJ Inject 140 mg into the skin every 28 (twenty-eight) days. 3 mL 0   Ferric Maltol (ACCRUFER) 30 MG CAPS Take 1 capsule every day by oral route.     ipratropium (ATROVENT ) 0.03 % nasal spray Place 2 sprays into both nostrils every 12 (twelve) hours. 30 mL 0   pantoprazole (PROTONIX) 40 MG tablet Take 40 mg by mouth 2 (two) times daily.     pregabalin  (LYRICA ) 150 MG capsule Take 1 capsule (150 mg total) by mouth  3 (three) times daily. 90 capsule 2   promethazine  (PHENERGAN ) 25 MG tablet Take 1 tablet (25 mg total) by mouth every 6 (six) hours as needed for nausea or vomiting. 30 tablet 5   Rimegepant Sulfate (NURTEC) 75 MG TBDP Take 1 tablet by mouth daily as needed.     tirzepatide (ZEPBOUND) 15 MG/0.5ML Pen Inject 15 mg into the skin once a week.     traMADol  (ULTRAM ) 50 MG tablet Take 1 tablet (50 mg total) by mouth every 8 (eight) hours as needed for severe pain (pain score 7-10). 15 tablet 0   traZODone (DESYREL) 100 MG tablet Take 100 mg by mouth at bedtime.     No current facility-administered medications on file prior to visit.     ALLERGIES: Allergies  Allergen Reactions   Sulfa Antibiotics Hives and Rash    FAMILY HISTORY: Family History  Problem Relation Age of Onset   Stroke Mother    Heart disease Maternal Grandmother    Aneurysm Maternal Grandfather    Ovarian cancer Paternal Grandmother    Prostate cancer Paternal Grandfather       Objective:  Blood pressure 109/74, pulse (!) 122, weight 208 lb 6.4 oz (94.5 kg), SpO2 96%. General: No acute distress.  Patient appears well-groomed.   Head:  Normocephalic/atraumatic Neck:  Supple.  No paraspinal tenderness.  Full range of motion. Heart:  Regular rate and rhythm. Neuro:  Alert and oriented.  Speech fluent and not dysarthric.  Language intact.  CN II-XII intact.  Bulk and tone normal.  Muscle strength 5/5 throughout.  Sensation to light touch intact.  Deep tendon reflexes 2+ throughout, toes downgoing.  Gait normal.  Romberg negative.    Madison Dunnings, DO  CC: Madison Cork, NP       "

## 2024-09-20 NOTE — Patient Instructions (Signed)
" °  VISIT SUMMARY: Today, we discussed your recent fall and head injury, ongoing musculoskeletal pain, right shoulder pain, and other related symptoms. We reviewed your chronic pain management plan and addressed your concerns about abnormal bruising and weight loss impacts.  YOUR PLAN: -CHRONIC PAIN SYNDROME WITH FIBROMYALGIA FEATURES: Chronic pain syndrome with fibromyalgia features involves widespread pain and tenderness. We will continue your current medications: pregabalin , duloxetine, and cyclobenzaprine . Use tramadol  as needed for severe pain, but avoid daily NSAIDs due to your history of peptic ulcer disease. Stay active as much as you can and plan to resume physical therapy in January.  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 We will refer you for further evaluation of your thoracic spine pain.  -RIGHT ROTATOR CUFF TENDINITIS AND BURSITIS: This condition involves inflammation of the shoulder tendons and bursa, causing pain and limited movement. You experienced improvement with ultrasound therapy, and we plan to resume physical therapy for your shoulder in January.  -HYPERMOBILITY SYNDROME: Hypermobility syndrome involves joints that move beyond the normal range, leading to pain and instability. Stay active within your tolerance to help manage symptoms.  -ABNORMAL BRUISING UNDER EVALUATION: We are investigating the cause of your frequent and unexplained bruising. We have ordered blood tests, including a complete blood count, metabolic panel, liver function tests, and a bleeding time assessment.  INSTRUCTIONS: Please follow up with the recommended blood tests and the referral for your thoracic spine evaluation. Resume physical therapy in January when your benefits reset.     "

## 2024-09-20 NOTE — Progress Notes (Signed)
 "  Subjective:    Patient ID: Madison Waters, female    DOB: 05-08-1981, 43 y.o.   MRN: [id number]  HPI Discussed the use of AI scribe software for clinical note transcription with the patient, who gave verbal consent to proceed.  History of Present Illness Madison Waters is a 43 year old female with hypermobility syndrome and chronic pain who presents for evaluation of musculoskeletal symptoms following a recent fall.  Recent fall and head injury - Sustained a fall in the bathroom two weeks ago after losing balance while leaning over, striking head and right shoulder against a metal shower rod - Developed daily headaches for approximately one week following the fall, uncertain if related to injury or barometric changes - No persistent sequelae from the fall  Generalized musculoskeletal pain and hypermobility - Chronic, generalized musculoskeletal pain - Diagnosed with hypermobility syndrome by Dr. Artist Lloyd - Ribs have 'popped out' on the right side three times; prior symptoms involving collarbone - Therapist identified right leg as approximately one inch longer than left; has favored one leg since childhood - Uses a cane for ambulation - Frequent imbalance and difficulty with coordination, especially when walking without assistive devices  Right shoulder pain and dysfunction - Severe right shoulder pain and restricted range of motion - Last physical therapy session included ultrasound therapy to right shoulder, which was beneficial - History of suspected rotator cuff tear; ultrasound therapy improved symptoms  Lower extremity and foot pain - Persistent pain in hips, legs, and feet - Allodynia present - Difficulty with activities requiring grip strength due to pain and weakness  Ecchymoses and abnormal bruising - Frequent, unexplained ecchymoses on legs, feet, and hands, with associated pain and tenderness - Most bruises are spontaneous; one on leg from minor trauma - Pain with  light palpation of legs  Migraine and medication use - Avoids regular NSAID use due to history of peptic ulcer disease - Uses dual-action NSAIDs rarely for severe pain or migraines - Prescribed tramadol  as needed for severe back pain but has not used it - Takes cyclobenzaprine  at bedtime  Weight loss and musculoskeletal impact - Significant weight loss with Zepbound; currently approximately 25 pounds from goal weight - Concern about increased musculoskeletal symptoms and loose skin following weight loss - Worsening thoracic back pain with activity, intensified despite weight loss  Functional status and activity limitation - Currently off work for two weeks, resulting in reduced lifting and physical activity    Pain Inventory Average Pain 8 Pain Right Now 8 My pain is sharp, stabbing, tingling, and aching  In the last 24 hours, has pain interfered with the following? General activity 8 Relation with others 5 Enjoyment of life 8 What TIME of day is your pain at its worst? morning , daytime, evening, and night Sleep (in general) Good  Pain is worse with: walking, bending, sitting, inactivity, and standing Pain improves with: rest, heat/ice, therapy/exercise, pacing activities, medication, and TENS Relief from Meds: 6  Family History  Problem Relation Age of Onset   Stroke Mother    Heart disease Maternal Grandmother    Aneurysm Maternal Grandfather    Ovarian cancer Paternal Grandmother    Prostate cancer Paternal Grandfather    Social History   Socioeconomic History   Marital status: Married    Spouse name: Not on file   Number of children: Not on file   Years of education: Not on file   Highest education level: Not on file  Occupational History  Not on file  Tobacco Use   Smoking status: Never   Smokeless tobacco: Never  Vaping Use   Vaping status: Never Used  Substance and Sexual Activity   Alcohol use: Not Currently   Drug use: Never   Sexual activity: Not  on file  Other Topics Concern   Not on file  Social History Narrative   Right handed   Social Drivers of Health   Tobacco Use: Low Risk (07/19/2024)   Patient History    Smoking Tobacco Use: Never    Smokeless Tobacco Use: Never    Passive Exposure: Not on file  Financial Resource Strain: Not on file  Food Insecurity: Not on file  Transportation Needs: Not on file  Physical Activity: Not on file  Stress: Not on file  Social Connections: Unknown (01/28/2022)   [facility]   Social Network    Social Network: Not on file  Depression (PHQ2-9): Low Risk (05/17/2024)   Depression (PHQ2-9)    PHQ-2 Score: 2  Recent Concern: Depression (PHQ2-9) - Medium Risk (03/01/2024)   Depression (PHQ2-9)    PHQ-2 Score: 10  Alcohol Screen: Not on file  Housing: Not on file  Utilities: Not on file  Health Literacy: Not on file   Past Surgical History:  Procedure Laterality Date   TONSILLECTOMY     WRIST FRACTURE SURGERY Left    Past Surgical History:  Procedure Laterality Date   TONSILLECTOMY     WRIST FRACTURE SURGERY Left    Past Medical History:  Diagnosis Date   Anemia    Asthma    GERD (gastroesophageal reflux disease)    High cholesterol    Migraines    Morbid (severe) obesity due to excess calories (HCC)    bmi 41.85   BP 97/65   Pulse (!) 101   Ht 5' 4 (1.626 m)   Wt 206 lb (93.4 kg)   SpO2 96%   BMI 35.36 kg/m   Opioid Risk Score:   Fall Risk Score:  `1  Depression screen [facility] 2/9     05/17/2024    9:36 AM 03/01/2024    9:57 AM  Depression screen PHQ 2/9  Decreased Interest 1 2  Down, Depressed, Hopeless 1 0  PHQ - 2 Score 2 2  Altered sleeping  3  Tired, decreased energy  2  Change in appetite  0  Feeling bad or failure about yourself   1  Trouble concentrating  2  Moving slowly or fidgety/restless  0  Suicidal thoughts  0  PHQ-9 Score  10   Difficult doing work/chores  Somewhat difficult     Data saved with a previous flowsheet row  definition    Review of Systems  Musculoskeletal:  Positive for back pain and gait problem.  All other systems reviewed and are negative.      Objective:   Physical Exam General: No acute distress HEENT: NCAT, EOMI, oral membranes moist Cards: reg rate  Chest: normal effort Abdomen: Soft, NT, ND Skin: dry, intact Extremities: no edema Psych: pleasant and appropriate  Skin: Clean and intact without signs of breakdown Neuro:  Alert and oriented x 3. Normal insight and awareness. Intact Memory. Normal language and speech. Cranial nerve exam unremarkable. MMT: 5/5 in all 4's. Sensory exam normal for light touch and pain in all 4 limbs.  Deep tendon reflexes were 2+ in both upper extremities.  No limb ataxia or cerebellar signs. No abnormal tone appreciated.  .   Musculoskeletal:improved  ROM right shoulder. Pelvis symmetrical, hips appear balanced. Jt are a bit hypermobile but don't appear excessively so.   Assessment & Plan:  Chronic pain syndrome/fibromyalgia/hypermobility ED syndrome Chronic widespread pain Sleep disorder Brain fog Depression Fatigue Migraine headaches. Myofascial pain   2. Cervical and lumbar DDD with spondylosis 3. Hx of + ANA 4. Hx of migraines prior to her current pain syndrome 5. Obesity 6. Right RTC syndrome         Plan: Continue with [facility] for myofascial and postural mgt/ R RTC mgt             -Dr. Joane for hypermobility syndrome   -tramadol  for more severe pain Cymbalta  90 mg daily for now For now continue lyrica  to 150 mg 3 times daily  -check cbc, cmet, INR given bruising Flexeril   10 mg at bedtime and daily as needed Follow up with rheumatology as directed Dr. Bonner to see for thoracic spine. Not much that can be done in that area.   7.   Weight loss with zepbound   8.   Supps and food have been presented which can help with joint pain.    Twenty minutes of face to face patient care time were spent during this visit.  All questions were encouraged and answered.  Follow up with me in 4 mos .,        "

## 2024-09-21 LAB — CBC
Hematocrit: 42.6 % (ref 34.0–46.6)
Hemoglobin: 14 g/dL (ref 11.1–15.9)
MCH: 30.7 pg (ref 26.6–33.0)
MCHC: 32.9 g/dL (ref 31.5–35.7)
MCV: 93 fL (ref 79–97)
Platelets: 333 x10E3/uL (ref 150–450)
RBC: 4.56 x10E6/uL (ref 3.77–5.28)
RDW: 12.6 % (ref 11.7–15.4)
WBC: 12.1 x10E3/uL — AB (ref 3.4–10.8)

## 2024-09-21 LAB — HEPATIC FUNCTION PANEL
ALT: 13 IU/L (ref 0–32)
AST: 13 IU/L (ref 0–40)
Albumin: 4.4 g/dL (ref 3.9–4.9)
Alkaline Phosphatase: 70 IU/L (ref 41–116)
Bilirubin Total: 0.2 mg/dL (ref 0.0–1.2)
Bilirubin, Direct: 0.09 mg/dL (ref 0.00–0.40)
Total Protein: 6.4 g/dL (ref 6.0–8.5)

## 2024-09-21 LAB — PROTIME-INR
INR: 0.9 (ref 0.9–1.2)
Prothrombin Time: 10.3 s (ref 9.1–12.0)

## 2024-09-22 ENCOUNTER — Ambulatory Visit (INDEPENDENT_AMBULATORY_CARE_PROVIDER_SITE_OTHER): Admitting: Neurology

## 2024-09-22 ENCOUNTER — Ambulatory Visit: Payer: Self-pay | Admitting: Physical Medicine & Rehabilitation

## 2024-09-22 VITALS — BP 109/74 | HR 122 | Wt 208.4 lb

## 2024-09-22 DIAGNOSIS — G43009 Migraine without aura, not intractable, without status migrainosus: Secondary | ICD-10-CM | POA: Diagnosis not present

## 2024-09-22 DIAGNOSIS — R202 Paresthesia of skin: Secondary | ICD-10-CM | POA: Diagnosis not present

## 2024-09-22 MED ORDER — NURTEC 75 MG PO TBDP: 1.0000 | ORAL_TABLET | Freq: Every day | ORAL | 5 refills | Status: AC | PRN

## 2024-09-22 NOTE — Patient Instructions (Signed)
 Aimovig  every 28 days Nurtec once daily as needed

## 2024-09-25 ENCOUNTER — Encounter: Payer: Self-pay | Admitting: Neurology

## 2024-10-05 ENCOUNTER — Encounter: Payer: Self-pay | Admitting: Family Medicine

## 2024-10-06 NOTE — Telephone Encounter (Signed)
 Pt scheduled in first available 10/16/2024 and added to wait list.

## 2024-10-09 ENCOUNTER — Encounter: Payer: Self-pay | Admitting: Family Medicine

## 2024-10-09 ENCOUNTER — Ambulatory Visit: Admitting: Family Medicine

## 2024-10-09 VITALS — BP 104/76 | HR 104 | Ht 64.0 in | Wt 203.0 lb

## 2024-10-09 DIAGNOSIS — M5414 Radiculopathy, thoracic region: Secondary | ICD-10-CM

## 2024-10-09 DIAGNOSIS — Q7962 Hypermobile Ehlers-Danlos syndrome: Secondary | ICD-10-CM

## 2024-10-09 DIAGNOSIS — G8929 Other chronic pain: Secondary | ICD-10-CM

## 2024-10-09 DIAGNOSIS — M25551 Pain in right hip: Secondary | ICD-10-CM

## 2024-10-09 DIAGNOSIS — I951 Orthostatic hypotension: Secondary | ICD-10-CM | POA: Insufficient documentation

## 2024-10-09 DIAGNOSIS — R0789 Other chest pain: Secondary | ICD-10-CM

## 2024-10-09 NOTE — Progress Notes (Unsigned)
"       ° °  I, Leotis Batter, CMA acting as a scribe for Artist Lloyd, MD.  Madison Waters is a 44 y.o. female who presents to [facility] [place] today for R hip pain in the setting of hEDS. Pt was last seen by Dr. Lloyd on 09/11/24 and was advised to cont HEP, resume PT, and prescribed Tramadol   On 1/8, pt sent MyChart message c/o R hip popping, painful, causing abnormal gait. Pt locates pain to lateral aspect of the hip, TTP, radiating into the groin. C/O weakness and tingling in the R LE. Also c/o left sided rub pain radiating from the back toward the front, TTP. Also notes worsening thoracic spine sx, brought MRI report today.   Pertinent review of systems: ***  Relevant historical information: ***   Exam:  There were no vitals taken for this visit. General: Well Developed, well nourished, and in no acute distress.   MSK: ***    Lab and Radiology Results No results found for this or any previous visit (from the past 72 hours). No results found.     Assessment and Plan: 44 y.o. female with ***   PDMP not reviewed this encounter. No orders of the defined types were placed in this encounter.  No orders of the defined types were placed in this encounter.    Discussed warning signs or symptoms. Please see discharge instructions. Patient expresses understanding.   ***  "

## 2024-10-09 NOTE — Patient Instructions (Addendum)
 Thank you for coming in today.   You received an injection today. Seek immediate medical attention if the joint becomes red, extremely painful, or is oozing fluid.   We're placing an order for an injection for your mid-back.   See you back as needed.

## 2024-10-13 ENCOUNTER — Encounter: Payer: Self-pay | Admitting: Family Medicine

## 2024-10-13 DIAGNOSIS — M25551 Pain in right hip: Secondary | ICD-10-CM

## 2024-10-16 ENCOUNTER — Ambulatory Visit: Admitting: Family Medicine

## 2024-10-20 ENCOUNTER — Encounter: Payer: Self-pay | Admitting: Family Medicine

## 2024-10-26 NOTE — Discharge Instructions (Signed)

## 2024-10-27 ENCOUNTER — Ambulatory Visit
Admission: RE | Admit: 2024-10-27 | Discharge: 2024-10-27 | Disposition: A | Source: Ambulatory Visit | Attending: Family Medicine | Admitting: Family Medicine

## 2024-10-27 DIAGNOSIS — Q7962 Hypermobile Ehlers-Danlos syndrome: Secondary | ICD-10-CM

## 2024-10-27 DIAGNOSIS — G8929 Other chronic pain: Secondary | ICD-10-CM

## 2024-10-27 MED ORDER — TRIAMCINOLONE ACETONIDE 40 MG/ML IJ SUSP (RADIOLOGY)
60.0000 mg | Freq: Once | INTRAMUSCULAR | Status: AC
  Administered 2024-10-27: 60 mg via EPIDURAL

## 2024-10-27 MED ORDER — IOPAMIDOL (ISOVUE-M 300) INJECTION 61%
1.0000 mL | Freq: Once | INTRAMUSCULAR | Status: AC | PRN
  Administered 2024-10-27: 1 mL via EPIDURAL

## 2024-10-28 ENCOUNTER — Other Ambulatory Visit: Payer: Self-pay | Admitting: Family Medicine

## 2024-10-28 ENCOUNTER — Encounter: Payer: Self-pay | Admitting: Family Medicine

## 2024-10-31 ENCOUNTER — Ambulatory Visit: Payer: Self-pay | Admitting: Family Medicine

## 2024-11-01 ENCOUNTER — Ambulatory Visit
Admission: RE | Admit: 2024-11-01 | Discharge: 2024-11-01 | Disposition: A | Source: Ambulatory Visit | Attending: Family Medicine | Admitting: Family Medicine

## 2024-11-01 DIAGNOSIS — M25551 Pain in right hip: Secondary | ICD-10-CM

## 2024-11-01 MED ORDER — IOPAMIDOL (ISOVUE-M 200) INJECTION 41%
1.0000 mL | Freq: Once | INTRAMUSCULAR | Status: AC
  Administered 2024-11-01: 10 mL via INTRA_ARTICULAR

## 2024-11-01 MED ORDER — GADOBENATE DIMEGLUMINE 529 MG/ML IV SOLN
0.1000 mL | Freq: Once | INTRAVENOUS | Status: AC
  Administered 2024-11-01: 0.1 mL via INTRA_ARTICULAR

## 2024-11-03 ENCOUNTER — Encounter: Payer: Self-pay | Admitting: Family Medicine

## 2024-11-13 ENCOUNTER — Ambulatory Visit: Admitting: Neurology

## 2024-12-11 ENCOUNTER — Ambulatory Visit: Admitting: Family Medicine

## 2025-01-17 ENCOUNTER — Encounter: Admitting: Physical Medicine & Rehabilitation

## 2025-09-24 ENCOUNTER — Ambulatory Visit: Payer: Self-pay | Admitting: Neurology
